# Patient Record
Sex: Female | Born: 1965
Health system: Southern US, Community
[De-identification: ages and names within clinical notes are randomized; demographics above are authoritative.]

## PROBLEM LIST (undated history)

## (undated) DIAGNOSIS — M7062 Trochanteric bursitis, left hip: Secondary | ICD-10-CM

## (undated) DIAGNOSIS — T7840XA Allergy, unspecified, initial encounter: Secondary | ICD-10-CM

## (undated) DIAGNOSIS — B029 Zoster without complications: Secondary | ICD-10-CM

## (undated) DIAGNOSIS — M25562 Pain in left knee: Secondary | ICD-10-CM

## (undated) HISTORY — DX: Pain in left knee: M25.562

## (undated) HISTORY — DX: Allergy, unspecified, initial encounter: T78.40XA

## (undated) HISTORY — DX: Trochanteric bursitis, left hip: M70.62

## (undated) HISTORY — DX: Zoster without complications: B02.9

---

## 2007-12-12 ENCOUNTER — Encounter: Admission: RE | Admit: 2007-12-12 | Discharge: 2007-12-12 | Payer: Self-pay | Admitting: Family Medicine

## 2010-07-30 ENCOUNTER — Encounter: Admission: RE | Admit: 2010-07-30 | Discharge: 2010-07-30 | Payer: Self-pay | Admitting: Family Medicine

## 2012-03-01 ENCOUNTER — Other Ambulatory Visit: Payer: Self-pay | Admitting: Internal Medicine

## 2012-03-01 ENCOUNTER — Other Ambulatory Visit: Payer: Self-pay | Admitting: Surgery

## 2012-03-01 DIAGNOSIS — N939 Abnormal uterine and vaginal bleeding, unspecified: Secondary | ICD-10-CM

## 2012-03-05 ENCOUNTER — Ambulatory Visit
Admission: RE | Admit: 2012-03-05 | Discharge: 2012-03-05 | Disposition: A | Discharge status: Home / Self Care | Payer: No Typology Code available for payment source | Source: Ambulatory Visit | Attending: Internal Medicine | Admitting: Internal Medicine

## 2012-03-05 DIAGNOSIS — N939 Abnormal uterine and vaginal bleeding, unspecified: Secondary | ICD-10-CM

## 2012-12-25 ENCOUNTER — Encounter (HOSPITAL_COMMUNITY): Payer: Self-pay | Admitting: *Deleted

## 2012-12-25 ENCOUNTER — Emergency Department (HOSPITAL_COMMUNITY)
Admission: EM | Admit: 2012-12-25 | Discharge: 2012-12-25 | Disposition: A | Discharge status: Home / Self Care | Payer: No Typology Code available for payment source | Attending: Emergency Medicine | Admitting: Emergency Medicine

## 2012-12-25 ENCOUNTER — Emergency Department (HOSPITAL_COMMUNITY): Payer: No Typology Code available for payment source

## 2012-12-25 DIAGNOSIS — M542 Cervicalgia: Secondary | ICD-10-CM

## 2012-12-25 DIAGNOSIS — M549 Dorsalgia, unspecified: Secondary | ICD-10-CM

## 2012-12-25 DIAGNOSIS — S0993XA Unspecified injury of face, initial encounter: Secondary | ICD-10-CM | POA: Insufficient documentation

## 2012-12-25 DIAGNOSIS — S199XXA Unspecified injury of neck, initial encounter: Secondary | ICD-10-CM | POA: Insufficient documentation

## 2012-12-25 DIAGNOSIS — IMO0002 Reserved for concepts with insufficient information to code with codable children: Secondary | ICD-10-CM | POA: Insufficient documentation

## 2012-12-25 DIAGNOSIS — Y92410 Unspecified street and highway as the place of occurrence of the external cause: Secondary | ICD-10-CM | POA: Insufficient documentation

## 2012-12-25 DIAGNOSIS — Y9389 Activity, other specified: Secondary | ICD-10-CM | POA: Insufficient documentation

## 2012-12-25 MED ORDER — NAPROXEN 500 MG PO TABS: 500.0000 mg | ORAL_TABLET | Freq: Two times a day (BID) | ORAL | Status: DC

## 2012-12-25 MED ORDER — METHOCARBAMOL 500 MG PO TABS: 500.0000 mg | ORAL_TABLET | Freq: Two times a day (BID) | ORAL | Status: DC

## 2012-12-25 MED ORDER — HYDROCODONE-ACETAMINOPHEN 5-325 MG PO TABS: 2.0000 | ORAL_TABLET | Freq: Four times a day (QID) | ORAL | Status: DC | PRN
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
## 2012-12-25 NOTE — ED Notes (Signed)
Placed in cervical collar at triage

## 2012-12-26 NOTE — ED Provider Notes (Signed)
Medical screening examination/treatment/procedure(s) were performed by non-physician practitioner and as supervising physician I was immediately available for consultation/collaboration.   Gavin Pound. Oletta Lamas, MD 12/26/12 2129

## 2015-05-28 DIAGNOSIS — M7062 Trochanteric bursitis, left hip: Secondary | ICD-10-CM

## 2015-05-28 DIAGNOSIS — M25562 Pain in left knee: Secondary | ICD-10-CM

## 2015-05-28 HISTORY — DX: Pain in left knee: M25.562

## 2015-05-28 HISTORY — DX: Trochanteric bursitis, left hip: M70.62

## 2015-08-24 ENCOUNTER — Ambulatory Visit (INDEPENDENT_AMBULATORY_CARE_PROVIDER_SITE_OTHER): Payer: Self-pay | Admitting: Internal Medicine

## 2015-08-24 ENCOUNTER — Encounter: Payer: Self-pay | Admitting: Internal Medicine

## 2015-08-24 VITALS — BP 138/84 | HR 88 | Resp 12 | Ht 61.0 in | Wt 204.2 lb

## 2015-08-24 DIAGNOSIS — Z79899 Other long term (current) drug therapy: Secondary | ICD-10-CM

## 2015-08-24 DIAGNOSIS — M7062 Trochanteric bursitis, left hip: Secondary | ICD-10-CM

## 2015-08-24 DIAGNOSIS — M25562 Pain in left knee: Secondary | ICD-10-CM

## 2015-08-24 MED ORDER — DICLOFENAC SODIUM 75 MG PO TBEC: 75.0000 mg | DELAYED_RELEASE_TABLET | Freq: Two times a day (BID) | ORAL | Status: DC

## 2015-08-24 NOTE — Patient Instructions (Signed)
Drink a glass of water before every meal Drink 6-8 glasses of water daily Eat three meals daily Eat a protein and healthy fat with every meal (eggs,fish, chicken, [place] and limit red meats) Eat 5 servings of vegetables daily, mix the colors Eat 2 servings of fruit daily with skin, if skin is edible Use smaller plates Put food/utensils down as you chew and swallow each bite Eat at a table with friends/family at least once daily, no TV Do not eat in front of the TV  Call if you do not hear from orange Card about xray appointment for your knee  Keep doing exercises

## 2015-08-24 NOTE — Progress Notes (Signed)
   Subjective:    Patient ID: Ana Pratt, female    DOB: 01/22/1966, 49 y.o.   MRN: [id number]  HPI   1.  Left Greater Trochanteric Bursitis:  Hip is fine now.  Now just a problem with the knee.    2.  Left Knee pain:  Diclofenac helped a lot, but ran out about 2 weeks ago and did not refill as she was concerned she would develop some renal insufficiency as her mother has.  Does have an orange card.  She states it worked well even once daily. Knee is most bothersome when gets up in the morning and goes down 12 steps.  She is fine initially, then the pain kicks in as she goes down the steps--really going down steps is her only problem.  She feels the knee won't support her and most of the discomfort is at the back of the knee--feels like it will "pop" in the back.  Possibly also feels like something is "caught" in her knee. Has had this for 1 year, no history of injury.  No history of an effusion/swelling No GI issues with the Diclofenac.    Review of Systems     Objective:   Physical Exam Left Knee:  Mildly decreased flexion, full extension.  Good strength in extending against an opposing force.  No palpable effusion.  Legs obese.  Tender along joint line both laterally and medially.  Tender in posteriorly as well, but no palpable mass.  NT with pressure against anterior patella.   NT and no laxity on stress of cruciates and lateral/medial collateral ligaments. Gait is normal on flat ground Normal DP and PT pulses       Assessment & Plan:  1.  Greater trochanteric bursitis:  Resolved with exercises and Diclofenac for about 1 month.  2.  Left knee pain:  Not clear of cause.  Possible chronic internal derangement.  Check Xray to start.  Check BMet and encouraged pt. To take Diclofenac once daily as controls pain well.  Weight loss also encouraged. Return in 3 months.

## 2015-08-25 LAB — COMPREHENSIVE METABOLIC PANEL
A/G RATIO: 1.4 (ref 1.1–2.5)
ALT: 23 IU/L (ref 0–32)
AST: 22 IU/L (ref 0–40)
Albumin: 4.1 g/dL (ref 3.5–5.5)
Alkaline Phosphatase: 71 IU/L (ref 39–117)
BUN/Creatinine Ratio: 15 (ref 9–23)
BUN: 12 mg/dL (ref 6–24)
Bilirubin Total: 0.3 mg/dL (ref 0.0–1.2)
CALCIUM: 9.5 mg/dL (ref 8.7–10.2)
CO2: 23 mmol/L (ref 18–29)
Chloride: 103 mmol/L (ref 97–106)
Creatinine, Ser: 0.79 mg/dL (ref 0.57–1.00)
GFR, EST AFRICAN AMERICAN: 100 mL/min/{1.73_m2} (ref >59)
GFR, EST NON AFRICAN AMERICAN: 87 mL/min/{1.73_m2} (ref >59)
GLUCOSE: 61 mg/dL — AB (ref 65–99)
Globulin, Total: 3 g/dL (ref 1.5–4.5)
POTASSIUM: 4.3 mmol/L (ref 3.5–5.2)
Sodium: 143 mmol/L (ref 136–144)
TOTAL PROTEIN: 7.1 g/dL (ref 6.0–8.5)

## 2015-09-03 NOTE — Progress Notes (Signed)
Quick Note:  09/03/15 Patient informed of results. ______

## 2015-10-26 ENCOUNTER — Encounter: Payer: Self-pay | Admitting: Internal Medicine

## 2015-10-26 ENCOUNTER — Ambulatory Visit (INDEPENDENT_AMBULATORY_CARE_PROVIDER_SITE_OTHER): Payer: Self-pay | Admitting: Internal Medicine

## 2015-10-26 VITALS — BP 132/80 | HR 78 | Ht 61.0 in | Wt 206.0 lb

## 2015-10-26 DIAGNOSIS — E669 Obesity, unspecified: Secondary | ICD-10-CM

## 2015-10-26 DIAGNOSIS — Z1239 Encounter for other screening for malignant neoplasm of breast: Secondary | ICD-10-CM

## 2015-10-26 DIAGNOSIS — M25562 Pain in left knee: Secondary | ICD-10-CM

## 2015-10-26 DIAGNOSIS — J302 Other seasonal allergic rhinitis: Secondary | ICD-10-CM

## 2015-10-26 MED ORDER — MOMETASONE FUROATE 50 MCG/ACT NA SUSP
NASAL | Status: DC
Start: 2015-10-26 — End: 2018-05-07

## 2015-10-26 MED ORDER — DESLORATADINE 5 MG PO TABS: 5.0000 mg | ORAL_TABLET | Freq: Every day | ORAL | Status: DC

## 2015-10-26 NOTE — Patient Instructions (Addendum)
Can google "advance directives, [place]"  And bring up form from Secretary of [place]. Print and fill out Or can go to "5 wishes"  Which is also in Spanish and fill out--this costs $5--perhaps easier to use. Designate a Cytogeneticist to speak for you if you are unable to speak for yourself when ill or injured  Drink a glass of water before every meal Drink 6-8 glasses of water daily Eat three meals daily Eat a protein and healthy fat with every meal (eggs,fish, chicken, [place] and limit red meats) Eat 5 servings of vegetables daily, mix the colors Eat 2 servings of fruit daily with skin, if skin is edible Use smaller plates Put food/utensils down as you chew and swallow each bite Eat at a table with friends/family at least once daily, no TV Do not eat in front of the TV  Get your [employer] done and in by tomorrow  Get started with walking laps in the pool (and start swim lessons) or utilizing elliptical or stationery bike 3 times weekly and do something else at home the other days.

## 2015-10-26 NOTE — Progress Notes (Signed)
   Subjective:    Patient ID: Ana Pratt, female    DOB: 13-Sep-1966, 50 y.o.   MRN: [id number]  HPI  Left Knee pain:  Taking Diclofenac regularly.  Pain well controlled when she takes twice daily.  Did not get the knee xray as ordered in November--never heard.    HM:  Documented from patient and old records health maintenance.   Will never get the influenzae vaccine. Cholesterol panel in 2015 showed total of 191, Triglycerides at 46, HDL of 71, and LDL 111.   TSH was 2.09 04/2014 as well.      Review of Systems     Objective:   Physical Exam HEENT:  PERRL, EOMI, eyelids a bit puffy, TMs pearly gray, unable to adequately see posterior pharynx, but no obvious erythems.  Tender over bilateral maxillary sinuses.  Nasal mucosa swollen and boggy Neck:  Supple, no adenopathy Chest:  CTA CV:  RRR without murmur or rub Radial and DP pulses normal and equal Left Knee:  No findings.  Gait is smooth        Assessment & Plan:  1.  Left Knee pain:  Continue Diclofenac.  Needs Xray still.  Has been submitted [facility] to [facility].  Encouraged physical activity to strengthen quads.  2.  Seasonal allergies:  Seems to be affecting patient with the warm weather and early blooming/moisture.  To start Clarinex and Nasonex.  To call if does not help.  May actually receive Cetirizine at [facility] pharmacy.  Can purchase decongestant separately if needed.  3.  Obesity:  Long discussion regarding healthy eating and physical activity.  Pt. To make short term goals to get started at [employer] with scholarship.  4.  HM:  Needs Mammogram States will never get a flu vaccine.   Unwilling to really give a reason. Tdap, colonoscopy, FLP, Hep C, pap up to date  3.

## 2015-10-26 NOTE — Addendum Note (Signed)
Addended by: Marcene Duos on: 10/26/2015 12:43 PM   Modules accepted: Kipp Brood

## 2015-10-29 ENCOUNTER — Other Ambulatory Visit: Payer: Self-pay | Admitting: Internal Medicine

## 2015-10-29 DIAGNOSIS — Z1231 Encounter for screening mammogram for malignant neoplasm of breast: Secondary | ICD-10-CM

## 2015-11-11 ENCOUNTER — Telehealth: Payer: Self-pay | Admitting: Internal Medicine

## 2015-11-12 ENCOUNTER — Ambulatory Visit
Admission: RE | Admit: 2015-11-12 | Discharge: 2015-11-12 | Disposition: A | Discharge status: Home / Self Care | Payer: No Typology Code available for payment source | Source: Ambulatory Visit | Attending: Internal Medicine | Admitting: Internal Medicine

## 2015-11-12 DIAGNOSIS — Z1231 Encounter for screening mammogram for malignant neoplasm of breast: Secondary | ICD-10-CM

## 2016-08-08 ENCOUNTER — Ambulatory Visit (INDEPENDENT_AMBULATORY_CARE_PROVIDER_SITE_OTHER): Payer: Self-pay | Admitting: Internal Medicine

## 2016-08-08 ENCOUNTER — Encounter: Payer: Self-pay | Admitting: Internal Medicine

## 2016-08-08 VITALS — BP 124/88 | HR 74 | Resp 12 | Ht 60.5 in | Wt 204.0 lb

## 2016-08-08 DIAGNOSIS — B029 Zoster without complications: Secondary | ICD-10-CM

## 2016-08-08 HISTORY — DX: Zoster without complications: B02.9

## 2016-08-08 MED ORDER — ACYCLOVIR 800 MG PO TABS: 800.0000 mg | ORAL_TABLET | Freq: Every day | ORAL | 0 refills | Status: DC

## 2016-08-08 MED ORDER — TRIAMCINOLONE ACETONIDE 0.1 % EX CREA: 1.0000 "application " | TOPICAL_CREAM | Freq: Two times a day (BID) | CUTANEOUS | 0 refills | Status: DC

## 2016-08-08 NOTE — Patient Instructions (Signed)
Capsaicin cream as needed to painful spots as needed. Don't rub your face with your left hand--especially stay away from your eyes

## 2016-08-08 NOTE — Progress Notes (Signed)
   Subjective:    Patient ID: Ana Pratt, female    DOB: 06/24/1966, 50 y.o.   MRN: [id number]  HPI   Left hand and forearm with rash that is itchy, burning, and painful.  Started about 6 days ago.   Meds: None  No Known Allergies    Review of Systems     Objective:   Physical Exam Cluster of deep vesicles at base of ulnar palm extending onto volar wrist.  Cluster of vesicles overlying PIP joint of 5th finger, small cluster in between middle and index finger MCP joints and scattered cluster on dorsal ulnar wrist.       Assessment & Plan:  Herpes Zoster exanthem involving what appears to be C8 dermatome. Acyclovir 800 mg 5 times daily for 7 days.  To call if too expensive.   Also to pick up capsaicin cream OTC and use as needed as well as Triamcinolone cream 0.1 % as needed.

## 2016-09-06 ENCOUNTER — Encounter: Payer: Self-pay | Admitting: Internal Medicine

## 2018-05-01 ENCOUNTER — Ambulatory Visit (INDEPENDENT_AMBULATORY_CARE_PROVIDER_SITE_OTHER): Payer: Self-pay | Admitting: Physician Assistant

## 2018-05-07 ENCOUNTER — Other Ambulatory Visit: Payer: Self-pay

## 2018-05-07 ENCOUNTER — Ambulatory Visit (INDEPENDENT_AMBULATORY_CARE_PROVIDER_SITE_OTHER): Payer: BLUE CROSS/BLUE SHIELD | Admitting: Physician Assistant

## 2018-05-07 ENCOUNTER — Encounter (INDEPENDENT_AMBULATORY_CARE_PROVIDER_SITE_OTHER): Payer: Self-pay | Admitting: Physician Assistant

## 2018-05-07 VITALS — BP 151/106 | HR 77 | Temp 98.2°F | Ht 61.81 in | Wt 211.0 lb

## 2018-05-07 DIAGNOSIS — R030 Elevated blood-pressure reading, without diagnosis of hypertension: Secondary | ICD-10-CM

## 2018-05-07 DIAGNOSIS — Z131 Encounter for screening for diabetes mellitus: Secondary | ICD-10-CM | POA: Diagnosis not present

## 2018-05-07 DIAGNOSIS — M25562 Pain in left knee: Secondary | ICD-10-CM

## 2018-05-07 DIAGNOSIS — M25561 Pain in right knee: Secondary | ICD-10-CM | POA: Diagnosis not present

## 2018-05-07 DIAGNOSIS — G8929 Other chronic pain: Secondary | ICD-10-CM

## 2018-05-07 LAB — POCT GLYCOSYLATED HEMOGLOBIN (HGB A1C): Hemoglobin A1C: 5.2 % (ref 4.0–5.6)

## 2018-05-07 MED ORDER — NAPROXEN 500 MG PO TABS: 500.0000 mg | ORAL_TABLET | Freq: Two times a day (BID) | ORAL | 0 refills | Status: DC

## 2018-05-07 NOTE — Patient Instructions (Signed)

## 2018-05-07 NOTE — Progress Notes (Signed)
Subjective:  Patient ID: Ana Pratt, female    DOB: 04/30/1966  Age: 52 y.o. MRN: [id number]  CC: joint pain   HPI Ana Pratt is a 52 y.o. female with a medical history of shingles, trochanteric bursitis, and seasonal allergies presents as a new patient with complaint of bilateral knee pain L > R for approximately one year. Lateral aspect of the knees are most pain and mostly when going up or down stairs. There is also an occasional "giving away" of the knee while walking. Knee has never "locked" before. Has not been seen by any outside providers. Takes Tumeric and "Tylenol Arthritis". Works as a Programmer, applicationshouse keeper and says regularly cleans the bathroom floors on her knees. Does not use knee pads as they usually fall off and become misplaced. Says there is also some pain in the ankles. Does not endorse any other symptoms or complaints.     Outpatient Medications Prior to Visit  Medication Sig Dispense Refill  . Multiple Vitamins-Minerals (MULTIVITAMIN WITH MINERALS) tablet Take 1 tablet by mouth daily.    . fish oil-omega-3 fatty acids 1000 MG capsule Take 1 g by mouth daily.    Marland Kitchen. acyclovir (ZOVIRAX) 800 MG tablet Take 1 tablet (800 mg total) by mouth 5 (five) times daily. For 7 days 35 tablet 0  . desloratadine (CLARINEX) 5 MG tablet Take 1 tablet (5 mg total) by mouth daily. (Patient not taking: Reported on 08/08/2016) 30 tablet 11  . diclofenac (VOLTAREN) 75 MG EC tablet Take 1 tablet (75 mg total) by mouth 2 (two) times daily. (Patient not taking: Reported on 08/08/2016) 60 tablet 6  . HYDROcodone-acetaminophen (NORCO/VICODIN) 5-325 MG per tablet Take 2 tablets by mouth every 6 (six) hours as needed for pain. 10 tablet 0  . methocarbamol (ROBAXIN) 500 MG tablet Take 1 tablet (500 mg total) by mouth 2 (two) times daily. 20 tablet 0  . mometasone (NASONEX) 50 MCG/ACT nasal spray 2 sprays each nostril once daily (Patient not taking: Reported on 08/08/2016) 17 g 11   . naproxen (NAPROSYN) 500 MG tablet Take 1 tablet (500 mg total) by mouth 2 (two) times daily. 30 tablet 0  . triamcinolone cream (KENALOG) 0.1 % Apply 1 application topically 2 (two) times daily. 80 g 0   No facility-administered medications prior to visit.      ROS Review of Systems  Constitutional: Negative for chills, fever and malaise/fatigue.  Eyes: Negative for blurred vision.  Respiratory: Negative for shortness of breath.   Cardiovascular: Negative for chest pain and palpitations.  Gastrointestinal: Negative for abdominal pain and nausea.  Genitourinary: Negative for dysuria and hematuria.  Musculoskeletal: Positive for joint pain. Negative for myalgias.  Skin: Negative for rash.  Neurological: Negative for tingling and headaches.  Psychiatric/Behavioral: Negative for depression. The patient is not nervous/anxious.     Objective:  Ht 5' 1.81" (1.57 m)   Wt 211 lb (95.7 kg)   LMP 07/27/2014 (Approximate) Comment: None for a year  BMI 38.83 kg/m    Vitals:   05/07/18 0858  BP: (!) 151/106  Pulse: 77  Temp: 98.2 F (36.8 C)  TempSrc: Oral  SpO2: 100%  Weight: 211 lb (95.7 kg)  Height: 5' 1.81" (1.57 m)     Physical Exam  Constitutional: She is oriented to person, place, and time.  Well developed, obese, NAD, polite  HENT:  Head: Normocephalic and atraumatic.  Eyes: No scleral icterus.  Neck: Normal range of motion. Neck supple. No thyromegaly present.  Cardiovascular: Normal rate, regular rhythm and normal heart sounds.  No LE edema bilaterally  Pulmonary/Chest: Effort normal and breath sounds normal.  Musculoskeletal: She exhibits no edema.  TTP in the lateral aspect of midline of knee bilaterally. Negative posterior/anterior drawer. Negative varus and valgus stress testing. Negative Thesaley test negative. Full aROM of knee.   Neurological: She is alert and oriented to person, place, and time.  Skin: Skin is warm and dry. No rash noted. No erythema. No  pallor.  Psychiatric: She has a normal mood and affect. Her behavior is normal. Thought content normal.  Vitals reviewed.    Assessment & Plan:   1. Chronic pain of both knees - DG Knee Complete 4 Views Left; Future - DG Knee Complete 4 Views Right; Future - Begin naproxen (NAPROSYN) 500 MG tablet; Take 1 tablet (500 mg total) by mouth 2 (two) times daily with a meal.  Dispense: 60 tablet; Refill: 0  2. Screening for diabetes mellitus - HgB A1c 5.2%  3. Elevated blood pressure reading without diagnosis of hypertension - Will recheck in one month  Meds ordered this encounter  Medications  . naproxen (NAPROSYN) 500 MG tablet    Sig: Take 1 tablet (500 mg total) by mouth 2 (two) times daily with a meal.    Dispense:  60 tablet    Refill:  0    Order Specific Question:   Supervising Provider    Answer:   Hoy RegisterNEWLIN, ENOBONG [[id number]]    Follow-up: Return in about 4 weeks (around 06/04/2018) for PAP, mammogram, and BP .   Loletta Specteroger David Tiyona Desouza PA

## 2018-05-25 ENCOUNTER — Ambulatory Visit (HOSPITAL_COMMUNITY)
Admission: RE | Admit: 2018-05-25 | Discharge: 2018-05-25 | Disposition: A | Discharge status: Home / Self Care | Payer: BLUE CROSS/BLUE SHIELD | Source: Ambulatory Visit | Attending: Physician Assistant | Admitting: Physician Assistant

## 2018-05-25 DIAGNOSIS — M25562 Pain in left knee: Secondary | ICD-10-CM | POA: Diagnosis present

## 2018-05-25 DIAGNOSIS — M170 Bilateral primary osteoarthritis of knee: Secondary | ICD-10-CM | POA: Insufficient documentation

## 2018-05-25 DIAGNOSIS — G8929 Other chronic pain: Secondary | ICD-10-CM | POA: Diagnosis not present

## 2018-05-25 DIAGNOSIS — M25561 Pain in right knee: Secondary | ICD-10-CM | POA: Insufficient documentation

## 2018-05-29 ENCOUNTER — Other Ambulatory Visit (INDEPENDENT_AMBULATORY_CARE_PROVIDER_SITE_OTHER): Payer: Self-pay | Admitting: Physician Assistant

## 2018-05-29 DIAGNOSIS — M175 Other unilateral secondary osteoarthritis of knee: Secondary | ICD-10-CM

## 2018-05-31 ENCOUNTER — Telehealth (INDEPENDENT_AMBULATORY_CARE_PROVIDER_SITE_OTHER): Payer: Self-pay

## 2018-05-31 NOTE — Telephone Encounter (Signed)
-----   Message from Loletta Specter, PA-C sent at 05/29/2018 12:59 PM EDT ----- Moderate to severe tricompartment osteoarthritis secondary to calcium crystal deposits. Will need to refer to orthopedics and have blood work done here for hyperparathyroidism, Wilson's diseaes, and hemachromatosis.

## 2018-05-31 NOTE — Telephone Encounter (Signed)
Left voicemail for someone to return call to [facility] at [phone]. Did not leave detailed message as it was not clear if the voicemail belonged to patient. Maryjean Morn, CMA

## 2018-06-01 ENCOUNTER — Telehealth (INDEPENDENT_AMBULATORY_CARE_PROVIDER_SITE_OTHER): Payer: Self-pay

## 2018-06-01 NOTE — Telephone Encounter (Signed)
-----   Message from Roger David Gomez, PA-C sent at 05/29/2018 12:59 PM EDT ----- Moderate to severe tricompartment osteoarthritis secondary to calcium crystal deposits. Will need to refer to orthopedics and have blood work done here for hyperparathyroidism, Wilson's diseaes, and hemachromatosis. 

## 2018-06-01 NOTE — Telephone Encounter (Signed)
Patient returned call. She did verify that the voicemail does belong to her but has a business greeting as she owns a [employer]. Patient is aware that crystal deposits in knee are causing osteoarthritis and the need for additional blood work to see why the crystals are forming; and referral to ortho. Patient has scheduled for 9/19. Maryjean Morn, CMA

## 2018-06-05 ENCOUNTER — Encounter (INDEPENDENT_AMBULATORY_CARE_PROVIDER_SITE_OTHER): Payer: Self-pay | Admitting: Orthopaedic Surgery

## 2018-06-05 ENCOUNTER — Ambulatory Visit (INDEPENDENT_AMBULATORY_CARE_PROVIDER_SITE_OTHER): Payer: BLUE CROSS/BLUE SHIELD | Admitting: Orthopaedic Surgery

## 2018-06-05 VITALS — Ht 61.0 in | Wt 210.0 lb

## 2018-06-05 DIAGNOSIS — M1712 Unilateral primary osteoarthritis, left knee: Secondary | ICD-10-CM

## 2018-06-05 DIAGNOSIS — M1711 Unilateral primary osteoarthritis, right knee: Secondary | ICD-10-CM

## 2018-06-05 NOTE — Progress Notes (Signed)
Office Visit Note   Patient: Ana Pratt           Date of Birth: 10-23-1965           MRN: [id number] Visit Date: 06/05/2018              Requested by: Loletta Specter, PA-C [address] [place], [place] [address] PCP: Loletta Specter, PA-C   Assessment & Plan: Visit Diagnoses:  1. Unilateral primary osteoarthritis, left knee   2. Unilateral primary osteoarthritis, right knee     Plan: Impression is bilateral knee osteoarthritis.  Overall her pain is 1 out of 10 which is fortunate.  We discussed nonsurgical and conservative treatment options for symptomatic relief as well as weight loss which I think could potentially slow down the progression of her degenerative joint disease.  Bilateral knee compression sleeve was applied today to help with her job.  Sample for Pennsaid was given today.  Questions encouraged and answered.  Follow-up as needed.  Follow-Up Instructions: Return if symptoms worsen or fail to improve.   Orders:  No orders of the defined types were placed in this encounter.  No orders of the defined types were placed in this encounter.     Procedures: No procedures performed   Clinical Data: No additional findings.   Subjective: Chief Complaint  Patient presents with  . Left Knee - Pain  . Right Knee - Pain    Patient is a very pleasant 52 year old female comes in with left greater than right knee pain for 1 year.  She does endorse buckling and cracking but denies any mechanical symptoms.  Denies any injuries.  Denies any numbness and tingling or swelling.  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  Denies any radiation pain.  Denies any previous history of surgery.   Review of Systems  Constitutional: Negative.   HENT: Negative.   Eyes: Negative.   Respiratory: Negative.   Cardiovascular: Negative.   Endocrine: Negative.   Musculoskeletal: Negative.   Neurological: Negative.   Hematological:  Negative.   Psychiatric/Behavioral: Negative.   All other systems reviewed and are negative.    Objective: Vital Signs: Ht 5\' 1"  (1.549 m)   Wt 210 lb (95.3 kg)   LMP 07/27/2014 (Approximate) Comment: None for a year  BMI 39.68 kg/m   Physical Exam  Constitutional: She is oriented to person, place, and time. She appears well-developed and well-nourished.  HENT:  Head: Normocephalic and atraumatic.  Eyes: EOM are normal.  Neck: Neck supple.  Pulmonary/Chest: Effort normal.  Abdominal: Soft.  Neurological: She is alert and oriented to person, place, and time.  Skin: Skin is warm. Capillary refill takes less than 2 seconds.  Psychiatric: She has a normal mood and affect. Her behavior is normal. Judgment and thought content normal.  Nursing note and vitals reviewed.   Ortho Exam Bilateral knee exam shows no joint effusion.  Collaterals and cruciates are stable.  Normal range of motion.  2+ patellofemoral crepitus. Specialty Comments:  No specialty comments available.  Imaging: No results found.   PMFS History: Patient Active Problem List   Diagnosis Date Noted  . Obesity 10/26/2015   Past Medical History:  Diagnosis Date  . Allergy    Spring--uses Claritin D.  . Greater trochanteric bursitis of left hip 05/2015  . Knee pain, left 05/2015  . Shingles age 23 or 52 yo  . Shingles 08/08/2016   Left C8 dermatome  Family History  Problem Relation Age of Onset  . Hypertension Mother   . Arthritis Mother   . Kidney disease Mother     Past Surgical History:  Procedure Laterality Date  . CESAREAN SECTION    . CESAREAN SECTION  1989, 2003   Twin birth in 1989   Social History   Occupational History  . Occupation: Merchant navy officer    Comment: [employer] and homes on the side  Tobacco Use  . Smoking status: Never Smoker  . Smokeless tobacco: Never Used  Substance and Sexual Activity  . Alcohol use: No    Alcohol/week: 0.0 standard drinks   . Drug use: No  . Sexual activity: Not on file

## 2018-06-14 ENCOUNTER — Other Ambulatory Visit: Payer: Self-pay

## 2018-06-14 ENCOUNTER — Other Ambulatory Visit (HOSPITAL_COMMUNITY)
Admission: RE | Admit: 2018-06-14 | Discharge: 2018-06-14 | Disposition: A | Discharge status: Home / Self Care | Payer: BLUE CROSS/BLUE SHIELD | Source: Ambulatory Visit | Attending: Physician Assistant | Admitting: Physician Assistant

## 2018-06-14 ENCOUNTER — Ambulatory Visit (INDEPENDENT_AMBULATORY_CARE_PROVIDER_SITE_OTHER): Payer: BLUE CROSS/BLUE SHIELD | Admitting: Physician Assistant

## 2018-06-14 ENCOUNTER — Encounter (INDEPENDENT_AMBULATORY_CARE_PROVIDER_SITE_OTHER): Payer: Self-pay | Admitting: Physician Assistant

## 2018-06-14 VITALS — BP 155/102 | HR 67 | Temp 98.0°F | Ht 61.0 in | Wt 217.6 lb

## 2018-06-14 DIAGNOSIS — M25562 Pain in left knee: Secondary | ICD-10-CM | POA: Insufficient documentation

## 2018-06-14 DIAGNOSIS — G8929 Other chronic pain: Secondary | ICD-10-CM

## 2018-06-14 DIAGNOSIS — I10 Essential (primary) hypertension: Secondary | ICD-10-CM | POA: Insufficient documentation

## 2018-06-14 DIAGNOSIS — Z124 Encounter for screening for malignant neoplasm of cervix: Secondary | ICD-10-CM

## 2018-06-14 DIAGNOSIS — Z1231 Encounter for screening mammogram for malignant neoplasm of breast: Secondary | ICD-10-CM | POA: Diagnosis not present

## 2018-06-14 DIAGNOSIS — Z1239 Encounter for other screening for malignant neoplasm of breast: Secondary | ICD-10-CM

## 2018-06-14 DIAGNOSIS — M25561 Pain in right knee: Secondary | ICD-10-CM | POA: Diagnosis not present

## 2018-06-14 MED ORDER — HYDROCHLOROTHIAZIDE 12.5 MG PO TABS: 12.5000 mg | ORAL_TABLET | Freq: Every day | ORAL | 6 refills | Status: DC

## 2018-06-14 MED ORDER — AMLODIPINE BESYLATE 5 MG PO TABS: 5.0000 mg | ORAL_TABLET | Freq: Every day | ORAL | 6 refills | Status: DC

## 2018-06-14 MED ORDER — DICLOFENAC SODIUM 1.5 % TD SOLN: TRANSDERMAL | 5 refills | Status: DC

## 2018-06-14 NOTE — Patient Instructions (Signed)

## 2018-06-14 NOTE — Progress Notes (Signed)
Subjective:  Patient ID: Ana Pratt, female    DOB: 1966-07-02  Age: 52 y.o. MRN: [id number]  CC: BP check and pap  HPI Ana Pratt is a 52 y.o. female with a medical history of shingles, trochanteric bursitis, and seasonal allergies presents for a BP check and pap smear. Denies vaginal discharge, dyspareunia, pelvic pain, genital lesions, f/c/n/v.     Last BP 151/106 mmHg 5 weeks ago. Diagnosed with elevated BP w/o dx of HTN. BP 155/102 mmHg in clinic today. Does not endorse any cardiovascular symptoms.     Outpatient Medications Prior to Visit  Medication Sig Dispense Refill  . Multiple Vitamins-Minerals (MULTIVITAMIN WITH MINERALS) tablet Take 1 tablet by mouth daily.    . fish oil-omega-3 fatty acids 1000 MG capsule Take 1 g by mouth daily.    . naproxen (NAPROSYN) 500 MG tablet Take 1 tablet (500 mg total) by mouth 2 (two) times daily with a meal. 60 tablet 0   No facility-administered medications prior to visit.      ROS Review of Systems  Constitutional: Negative for chills, fever and malaise/fatigue.  Eyes: Negative for blurred vision.  Respiratory: Negative for shortness of breath.   Cardiovascular: Negative for chest pain and palpitations.  Gastrointestinal: Negative for abdominal pain and nausea.  Genitourinary: Negative for dysuria and hematuria.  Musculoskeletal: Negative for joint pain and myalgias.  Skin: Negative for rash.  Neurological: Negative for tingling and headaches.  Psychiatric/Behavioral: Negative for depression. The patient is not nervous/anxious.     Objective:  BP (!) 155/102 (BP Location: Left Arm, Patient Position: Sitting, Cuff Size: Large)   Pulse 67   Temp 98 F (36.7 C) (Oral)   Ht 5\' 1"  (1.549 m)   Wt 217 lb 9.6 oz (98.7 kg)   LMP 07/27/2014 (Approximate) Comment: None for a year  SpO2 100%   BMI 41.12 kg/m   BP/Weight 06/14/2018 06/05/2018 05/07/2018  Systolic BP 155 - 151  Diastolic BP 102 - 106  Wt. (Lbs)  217.6 210 211  BMI 41.12 39.68 38.83      Physical Exam  Constitutional: She is oriented to person, place, and time.  Well developed, obese, NAD, polite  HENT:  Head: Normocephalic and atraumatic.  Eyes: No scleral icterus.  Neck: Normal range of motion. Neck supple. No thyromegaly present.  Pulmonary/Chest: Effort normal.  Genitourinary:  Genitourinary Comments: No cervical motion tenderness. No adnexal tenderness to palpation or mass felt bilaterally. No uterine mass or tenderness.   Musculoskeletal: She exhibits no edema.  Neurological: She is alert and oriented to person, place, and time.  Skin: Skin is warm and dry. No rash noted. No erythema. No pallor.  Psychiatric: She has a normal mood and affect. Her behavior is normal. Thought content normal.  Vitals reviewed.    Assessment & Plan:   1. Screening for cervical cancer - Cytology - PAP([facility])  2. Screening for breast cancer - [facility] DIGITAL SCREENING BILATERAL; Future  3. Hypertension, unspecified type - Begin hydrochlorothiazide (HYDRODIURIL) 12.5 MG tablet; Take 1 tablet (12.5 mg total) by mouth daily.  Dispense: 30 tablet; Refill: 6 - Begin amLODipine (NORVASC) 5 MG tablet; Take 1 tablet (5 mg total) by mouth daily.  Dispense: 30 tablet; Refill: 6  4. Chronic pain of both knees - Begin Diclofenac Sodium 1.5 % SOLN; Apply two pump activations to affected knee two times a day.  Dispense: 1 Bottle; Refill: 5 - Pt had been discharged when she asked to have Pennsaid  prescribed for her. Says that a friend gave her samples and felt much relief. Does not like taking pills.   Meds ordered this encounter  Medications  . hydrochlorothiazide (HYDRODIURIL) 12.5 MG tablet    Sig: Take 1 tablet (12.5 mg total) by mouth daily.    Dispense:  30 tablet    Refill:  6    Order Specific Question:   Supervising Provider    Answer:   Hoy RegisterNEWLIN, ENOBONG [[id number]]  . amLODipine (NORVASC) 5 MG tablet    Sig: Take 1 tablet (5 mg total)  by mouth daily.    Dispense:  30 tablet    Refill:  6    Order Specific Question:   Supervising Provider    Answer:   Hoy RegisterNEWLIN, ENOBONG [[id number]]  . Diclofenac Sodium 1.5 % SOLN    Sig: Apply two pump activations to affected knee two times a day.    Dispense:  1 Bottle    Refill:  5    Order Specific Question:   Supervising Provider    Answer:   Hoy RegisterNEWLIN, ENOBONG [[id number]]    Follow-up: Return in about 4 weeks (around 07/12/2018) for HTN f/u.   Loletta Specteroger David Gomez PA

## 2018-06-15 LAB — CYTOLOGY - PAP
Bacterial vaginitis: NEGATIVE
CHLAMYDIA, DNA PROBE: NEGATIVE
Candida vaginitis: NEGATIVE
Diagnosis: NEGATIVE
NEISSERIA GONORRHEA: NEGATIVE
TRICH (WINDOWPATH): NEGATIVE

## 2018-06-18 ENCOUNTER — Telehealth (INDEPENDENT_AMBULATORY_CARE_PROVIDER_SITE_OTHER): Payer: Self-pay

## 2018-06-18 NOTE — Telephone Encounter (Signed)
Patient aware that pap is completely negative. Ana Pratt, CMA

## 2018-06-18 NOTE — Telephone Encounter (Signed)
-----   Message from Loletta Specteroger David Gomez, PA-C sent at 06/18/2018  8:49 AM EDT ----- Pap completely negative.

## 2018-06-25 ENCOUNTER — Telehealth (INDEPENDENT_AMBULATORY_CARE_PROVIDER_SITE_OTHER): Payer: Self-pay | Admitting: Orthopaedic Surgery

## 2018-06-25 NOTE — Telephone Encounter (Signed)
Beth from [facility] called needing order for PT faxed over to her. The fax number is [phone]. The phone number is [phone]

## 2018-06-25 NOTE — Telephone Encounter (Signed)
Did you want patient to do PT?

## 2018-06-26 NOTE — Telephone Encounter (Signed)
faxed

## 2018-06-26 NOTE — Telephone Encounter (Signed)
Yes for OA of knees.  Quad strengthening.  HEP

## 2018-07-18 ENCOUNTER — Encounter (INDEPENDENT_AMBULATORY_CARE_PROVIDER_SITE_OTHER): Payer: Self-pay | Admitting: Physician Assistant

## 2018-07-18 ENCOUNTER — Ambulatory Visit (INDEPENDENT_AMBULATORY_CARE_PROVIDER_SITE_OTHER): Payer: BLUE CROSS/BLUE SHIELD | Admitting: Physician Assistant

## 2018-07-18 VITALS — BP 126/85 | HR 83 | Temp 98.2°F | Resp 18 | Ht 61.0 in | Wt 216.0 lb

## 2018-07-18 DIAGNOSIS — G8929 Other chronic pain: Secondary | ICD-10-CM | POA: Diagnosis not present

## 2018-07-18 DIAGNOSIS — M25561 Pain in right knee: Secondary | ICD-10-CM

## 2018-07-18 DIAGNOSIS — I10 Essential (primary) hypertension: Secondary | ICD-10-CM

## 2018-07-18 DIAGNOSIS — M25562 Pain in left knee: Secondary | ICD-10-CM

## 2018-07-18 MED ORDER — MELOXICAM 15 MG PO TABS: 15.0000 mg | ORAL_TABLET | Freq: Every day | ORAL | 1 refills | Status: DC

## 2018-07-18 NOTE — Patient Instructions (Signed)

## 2018-07-18 NOTE — Progress Notes (Signed)
Subjective:  Patient ID: Ana Pratt, female    DOB: 07/29/1966  Age: 52 y.o. MRN: [id number]  CC: HTN f/u   HPI [place] Erica Pratt a 52 y.o.femalewith a medical history of shingles, trochanteric bursitis, and seasonal allergies presents for HTN. Last BP 155/102 mmHg last month. She was prescribed HCTZ 12.5 mg and amlodipine 5 mg. BP 126/85 mmHg today. Did not take her anti-hypertensives yet today as she came in fasting for blood draw. Says she is feeling well and her knee pain has decreased significantly with physical therapy. Dr Roda Shutters is her orthopedic specialist and has diagnosed primary OA of the knee bilaterally. Otherwise, pt denies CP, palpitations, PND, orthopnea, presyncope, syncope, SOB, HA, tingling, numbness, abdominal pain, f/c/n/v, rash, swelling, or GI/GU sxs.     Outpatient Medications Prior to Visit  Medication Sig Dispense Refill  . amLODipine (NORVASC) 5 MG tablet Take 1 tablet (5 mg total) by mouth daily. 30 tablet 6  . Diclofenac Sodium 1.5 % SOLN Apply two pump activations to affected knee two times a day. 1 Bottle 5  . fish oil-omega-3 fatty acids 1000 MG capsule Take 1 g by mouth daily.    . hydrochlorothiazide (HYDRODIURIL) 12.5 MG tablet Take 1 tablet (12.5 mg total) by mouth daily. 30 tablet 6  . Multiple Vitamins-Minerals (MULTIVITAMIN WITH MINERALS) tablet Take 1 tablet by mouth daily.     No facility-administered medications prior to visit.      ROS Review of Systems  Constitutional: Negative for chills, fever and malaise/fatigue.  Eyes: Negative for blurred vision.  Respiratory: Negative for shortness of breath.   Cardiovascular: Negative for chest pain and palpitations.  Gastrointestinal: Negative for abdominal pain and nausea.  Genitourinary: Negative for dysuria and hematuria.  Musculoskeletal: Positive for joint pain. Negative for myalgias.  Skin: Negative for rash.  Neurological: Negative for tingling and headaches.   Psychiatric/Behavioral: Negative for depression. The patient is not nervous/anxious.     Objective:  BP 126/85 (BP Location: Left Arm, Patient Position: Sitting, Cuff Size: Large)   Pulse 83   Temp 98.2 F (36.8 C) (Oral)   Resp 18   Ht 5\' 1"  (1.549 m)   Wt 216 lb (98 kg)   LMP 07/27/2014 (Approximate) Comment: None for a year  SpO2 100%   BMI 40.81 kg/m   BP/Weight 07/18/2018 06/14/2018 06/05/2018  Systolic BP 126 155 -  Diastolic BP 85 102 -  Wt. (Lbs) 216 217.6 210  BMI 40.81 41.12 39.68      Physical Exam  Constitutional: She is oriented to person, place, and time.  Well developed, obese, NAD, polite  HENT:  Head: Normocephalic and atraumatic.  Eyes: No scleral icterus.  Neck: Normal range of motion. Neck supple. No thyromegaly present.  Cardiovascular: Normal rate, regular rhythm and normal heart sounds.  No LE edema bilaterally or carotid bruit bilaterally  Pulmonary/Chest: Effort normal and breath sounds normal.  Musculoskeletal: She exhibits no edema.  Neurological: She is alert and oriented to person, place, and time.  Gait normal  Skin: Skin is warm and dry. No rash noted. No erythema. No pallor.  Psychiatric: She has a normal mood and affect. Her behavior is normal. Thought content normal.  Vitals reviewed.    Assessment & Plan:   1. Hypertension, unspecified type - Lipid panel - Comprehensive metabolic panel  2. Chronic pain of both knees - Begin meloxicam (MOBIC) 15 MG tablet; Take 1 tablet (15 mg total) by mouth daily.  Dispense: 30 tablet;  Refill: 1 - Pt doing well with physical therapy order by Dr. Roda Shutters, her orthopedic specialist  Meds ordered this encounter  Medications  . meloxicam (MOBIC) 15 MG tablet    Sig: Take 1 tablet (15 mg total) by mouth daily.    Dispense:  30 tablet    Refill:  1    Order Specific Question:   Supervising Provider    Answer:   Hoy Register [[id number]]    Follow-up: Return in about 6 months (around 01/17/2019).    Loletta Specter PA

## 2018-07-19 ENCOUNTER — Telehealth (INDEPENDENT_AMBULATORY_CARE_PROVIDER_SITE_OTHER): Payer: Self-pay

## 2018-07-19 ENCOUNTER — Other Ambulatory Visit (INDEPENDENT_AMBULATORY_CARE_PROVIDER_SITE_OTHER): Payer: Self-pay | Admitting: Physician Assistant

## 2018-07-19 DIAGNOSIS — E7841 Elevated Lipoprotein(a): Secondary | ICD-10-CM

## 2018-07-19 LAB — COMPREHENSIVE METABOLIC PANEL
ALBUMIN: 4.1 g/dL (ref 3.5–5.5)
ALK PHOS: 57 IU/L (ref 39–117)
ALT: 14 IU/L (ref 0–32)
AST: 14 IU/L (ref 0–40)
Albumin/Globulin Ratio: 1.5 (ref 1.2–2.2)
BUN/Creatinine Ratio: 14 (ref 9–23)
BUN: 12 mg/dL (ref 6–24)
Bilirubin Total: 0.2 mg/dL (ref 0.0–1.2)
CO2: 24 mmol/L (ref 20–29)
CREATININE: 0.87 mg/dL (ref 0.57–1.00)
Calcium: 9.7 mg/dL (ref 8.7–10.2)
Chloride: 104 mmol/L (ref 96–106)
GFR calc Af Amer: 87 mL/min/{1.73_m2} (ref >59)
GFR calc non Af Amer: 76 mL/min/{1.73_m2} (ref >59)
GLUCOSE: 92 mg/dL (ref 65–99)
Globulin, Total: 2.7 g/dL (ref 1.5–4.5)
Potassium: 4 mmol/L (ref 3.5–5.2)
Sodium: 142 mmol/L (ref 134–144)
Total Protein: 6.8 g/dL (ref 6.0–8.5)

## 2018-07-19 LAB — LIPID PANEL
Chol/HDL Ratio: 2.9 ratio (ref 0.0–4.4)
Cholesterol, Total: 189 mg/dL (ref 100–199)
HDL: 65 mg/dL (ref >39)
LDL Calculated: 112 mg/dL — ABNORMAL HIGH (ref 0–99)
Triglycerides: 59 mg/dL (ref 0–149)
VLDL Cholesterol Cal: 12 mg/dL (ref 5–40)

## 2018-07-19 MED ORDER — ATORVASTATIN CALCIUM 20 MG PO TABS: 20.0000 mg | ORAL_TABLET | Freq: Every day | ORAL | 1 refills | Status: DC

## 2018-07-19 NOTE — Telephone Encounter (Signed)
-----   Message from Loletta Specter, PA-C sent at 07/19/2018  9:36 AM EDT ----- LDL mildly elevated. I have sent atorvastatin to [facility] at [facility]. Rest of labs normal.

## 2018-07-19 NOTE — Telephone Encounter (Signed)
Left voicemail notifying patient that LDL is mildly elevated. Atorvastatin sent to [facility]. All other labs normal. Call [facility] at [phone] with any questions or concerns.Maryjean Morn, CMA

## 2018-07-26 ENCOUNTER — Ambulatory Visit
Admission: RE | Admit: 2018-07-26 | Discharge: 2018-07-26 | Disposition: A | Discharge status: Home / Self Care | Payer: BLUE CROSS/BLUE SHIELD | Source: Ambulatory Visit | Attending: Physician Assistant | Admitting: Physician Assistant

## 2018-07-26 DIAGNOSIS — Z1239 Encounter for other screening for malignant neoplasm of breast: Secondary | ICD-10-CM

## 2019-01-17 ENCOUNTER — Ambulatory Visit (INDEPENDENT_AMBULATORY_CARE_PROVIDER_SITE_OTHER): Payer: Self-pay | Admitting: Primary Care

## 2019-01-17 ENCOUNTER — Encounter: Payer: Self-pay | Admitting: Primary Care

## 2019-01-17 ENCOUNTER — Ambulatory Visit: Payer: BLUE CROSS/BLUE SHIELD | Attending: Physician Assistant | Admitting: Primary Care

## 2019-01-17 ENCOUNTER — Telehealth: Payer: Self-pay | Admitting: Physician Assistant

## 2019-01-17 ENCOUNTER — Other Ambulatory Visit: Payer: Self-pay

## 2019-01-17 DIAGNOSIS — I10 Essential (primary) hypertension: Secondary | ICD-10-CM | POA: Diagnosis not present

## 2019-01-17 DIAGNOSIS — E7841 Elevated Lipoprotein(a): Secondary | ICD-10-CM

## 2019-01-17 DIAGNOSIS — Z0000 Encounter for general adult medical examination without abnormal findings: Secondary | ICD-10-CM

## 2019-01-17 MED ORDER — MULTI-VITAMIN/MINERALS PO TABS: 1.0000 | ORAL_TABLET | Freq: Every day | ORAL | 11 refills | Status: AC

## 2019-01-17 MED ORDER — ATORVASTATIN CALCIUM 20 MG PO TABS: 20.0000 mg | ORAL_TABLET | Freq: Every day | ORAL | 3 refills | Status: DC

## 2019-01-17 MED ORDER — HYDROCHLOROTHIAZIDE 12.5 MG PO TABS: 12.5000 mg | ORAL_TABLET | Freq: Every day | ORAL | 3 refills | Status: DC

## 2019-01-17 MED ORDER — AMLODIPINE BESYLATE 5 MG PO TABS: 5.0000 mg | ORAL_TABLET | Freq: Every day | ORAL | 3 refills | Status: DC

## 2019-01-17 NOTE — Telephone Encounter (Signed)
Called patient to scheduled a fasting lab appt. LVM to return call.

## 2019-01-17 NOTE — Progress Notes (Signed)
Virtual Visit via Telephone Note  I connected with Ana Pratt on 01/17/19 at  8:40 AM EDT by telephone and verified that I am speaking with the correct person using two identifiers.   I discussed the limitations, risks, security and privacy concerns of performing an evaluation and management service by telephone and the availability of in person appointments. I also discussed with the patient that there may be a patient responsible charge related to this service. The patient expressed understanding and agreed to proceed.   History of Present Illness: Ana Pratt routine follow up. She does not have a Bp machine to check her Bp. She has stopped taking her all of her medications when they ran out she did not call for a refill. Explained the risk of uncontrolled HTN and hyperlipidemia. States she feels better she is exercising and loosing weight. Patient is to come in for fasting labs and all meds refilled. PMH  of shingles, trochanteric bursitis, and seasonal allergies. She is having   Observations/Objective: Review of Systems  Constitutional: Negative.   Eyes: Negative.   Respiratory: Negative.   Cardiovascular: Negative.   Gastrointestinal: Negative.   Genitourinary: Negative.   Skin: Negative.   Neurological: Negative.   Endo/Heme/Allergies: Negative.     Assessment and Plan: Ana was seen today for follow-up.  Diagnoses and all orders for this visit:  Routine check-up -     CBC with Differential/Platelet; Future -     VITAMIN D 25 Hydroxy (Vit-D Deficiency, Fractures); Future  Hypertension, unspecified type Hypertension  This is a chronic problem. The current episode started more than 1 year ago. Progression since onset: unknown will ck Bp when she comes in for labs. Risk factors for coronary artery disease include obesity, post-menopausal state and dyslipidemia. Past treatments include calcium channel blockers and lifestyle changes. Hypertensive end-organ damage  includes PVD.   Elevated lipoprotein(a) -     atorvastatin (LIPITOR) 20 MG tablet; Take 1 tablet (20 mg total) by mouth daily. -     Lipid panel; Future Hyperlipidemia  The current episode started more than 1 year ago. Exacerbating diseases include obesity. Current antihyperlipidemic treatment includes diet change. Improvement on treatment: unknown stop messing. Compliance problems include adherence to exercise.  Risk factors for coronary artery disease include hypertension, dyslipidemia, post-menopausal and obesity.  -     amLODipine (NORVASC) 5 MG tablet; Take 1 tablet (5 mg total) by mouth daily. -     hydrochlorothiazide (HYDRODIURIL) 12.5 MG tablet; Take 1 tablet (12.5 mg total) by mouth daily. -     Comprehensive metabolic panel; Future  Other orders -     Multiple Vitamins-Minerals (MULTIVITAMIN WITH MINERALS) tablet; Take 1 tablet by mouth daily for 30 days.    Follow Up Instructions:    I discussed the assessment and treatment plan with the patient. The patient was provided an opportunity to ask questions and all were answered. The patient agreed with the plan and demonstrated an understanding of the instructions.   The patient was advised to call back or seek an in-person evaluation if the symptoms worsen or if the condition fails to improve as anticipated.  I provided 20 minutes of non-face-to-face time during this encounter.   Grayce Sessions, NP

## 2019-01-17 NOTE — Progress Notes (Signed)
Patient verified DOB Patient has not taken medication today. Patient has not eaten today. Patient denies pain at this time.  

## 2019-01-21 ENCOUNTER — Ambulatory Visit: Payer: BLUE CROSS/BLUE SHIELD | Attending: Family Medicine

## 2019-01-21 ENCOUNTER — Other Ambulatory Visit: Payer: Self-pay

## 2019-01-21 DIAGNOSIS — I10 Essential (primary) hypertension: Secondary | ICD-10-CM

## 2019-01-21 DIAGNOSIS — Z0000 Encounter for general adult medical examination without abnormal findings: Secondary | ICD-10-CM

## 2019-01-21 DIAGNOSIS — E7841 Elevated Lipoprotein(a): Secondary | ICD-10-CM

## 2019-01-22 LAB — CBC WITH DIFFERENTIAL/PLATELET
Basophils Absolute: 0.1 x10E3/uL (ref 0.0–0.2)
Basos: 1 %
EOS (ABSOLUTE): 0.2 x10E3/uL (ref 0.0–0.4)
Eos: 4 %
Hematocrit: 39.2 % (ref 34.0–46.6)
Hemoglobin: 13.1 g/dL (ref 11.1–15.9)
Immature Grans (Abs): 0 x10E3/uL (ref 0.0–0.1)
Immature Granulocytes: 0 %
Lymphocytes Absolute: 1.6 x10E3/uL (ref 0.7–3.1)
Lymphs: 40 %
MCH: 26.4 pg — ABNORMAL LOW (ref 26.6–33.0)
MCHC: 33.4 g/dL (ref 31.5–35.7)
MCV: 79 fL (ref 79–97)
Monocytes Absolute: 0.4 x10E3/uL (ref 0.1–0.9)
Monocytes: 10 %
Neutrophils Absolute: 1.9 x10E3/uL (ref 1.4–7.0)
Neutrophils: 45 %
Platelets: 361 x10E3/uL (ref 150–450)
RBC: 4.97 x10E6/uL (ref 3.77–5.28)
RDW: 14.1 % (ref 11.7–15.4)
WBC: 4.1 x10E3/uL (ref 3.4–10.8)

## 2019-01-22 LAB — COMPREHENSIVE METABOLIC PANEL
ALT: 18 IU/L (ref 0–32)
AST: 15 IU/L (ref 0–40)
Albumin/Globulin Ratio: 1.7 (ref 1.2–2.2)
Albumin: 4.3 g/dL (ref 3.8–4.9)
Alkaline Phosphatase: 60 IU/L (ref 39–117)
BUN/Creatinine Ratio: 17 (ref 9–23)
BUN: 14 mg/dL (ref 6–24)
Bilirubin Total: 0.3 mg/dL (ref 0.0–1.2)
CO2: 21 mmol/L (ref 20–29)
Calcium: 9.8 mg/dL (ref 8.7–10.2)
Chloride: 107 mmol/L — ABNORMAL HIGH (ref 96–106)
Creatinine, Ser: 0.82 mg/dL (ref 0.57–1.00)
GFR calc Af Amer: 93 mL/min/{1.73_m2} (ref >59)
GFR calc non Af Amer: 81 mL/min/{1.73_m2} (ref >59)
Globulin, Total: 2.6 g/dL (ref 1.5–4.5)
Glucose: 89 mg/dL (ref 65–99)
Potassium: 4.6 mmol/L (ref 3.5–5.2)
Sodium: 142 mmol/L (ref 134–144)
Total Protein: 6.9 g/dL (ref 6.0–8.5)

## 2019-01-22 LAB — LIPID PANEL
Chol/HDL Ratio: 3 ratio (ref 0.0–4.4)
Cholesterol, Total: 204 mg/dL — ABNORMAL HIGH (ref 100–199)
HDL: 69 mg/dL (ref >39)
LDL Calculated: 127 mg/dL — ABNORMAL HIGH (ref 0–99)
Triglycerides: 42 mg/dL (ref 0–149)
VLDL Cholesterol Cal: 8 mg/dL (ref 5–40)

## 2019-01-25 LAB — SPECIMEN STATUS REPORT

## 2019-01-25 LAB — VITAMIN D 25 HYDROXY (VIT D DEFICIENCY, FRACTURES): Vit D, 25-Hydroxy: 31 ng/mL (ref 30.0–100.0)

## 2019-01-28 ENCOUNTER — Telehealth: Payer: Self-pay | Admitting: *Deleted

## 2019-01-28 NOTE — Telephone Encounter (Signed)
error 

## 2019-01-28 NOTE — Telephone Encounter (Signed)
Patient verified DOB Patient is aware of continuing cholesterol medication and vitamin d OTC 2000 for stable level. No further questions at this time.

## 2019-01-28 NOTE — Telephone Encounter (Signed)
-----   Message from Grayce Sessions, NP sent at 01/27/2019 11:14 PM EDT ----- Just made normal needs otc vit D 2000iu qd

## 2019-09-15 IMAGING — DX DG KNEE COMPLETE 4+V*R*
4 series · 4 of 4 positions shown · non-contrast
Comparison: None.

CLINICAL DATA: Chronic greater than 1 year history of BILATERAL
knee pain, LEFT greater than RIGHT. No known injuries.

EXAM:
RIGHT KNEE - COMPLETE 4+ VIEW

[knee ap]
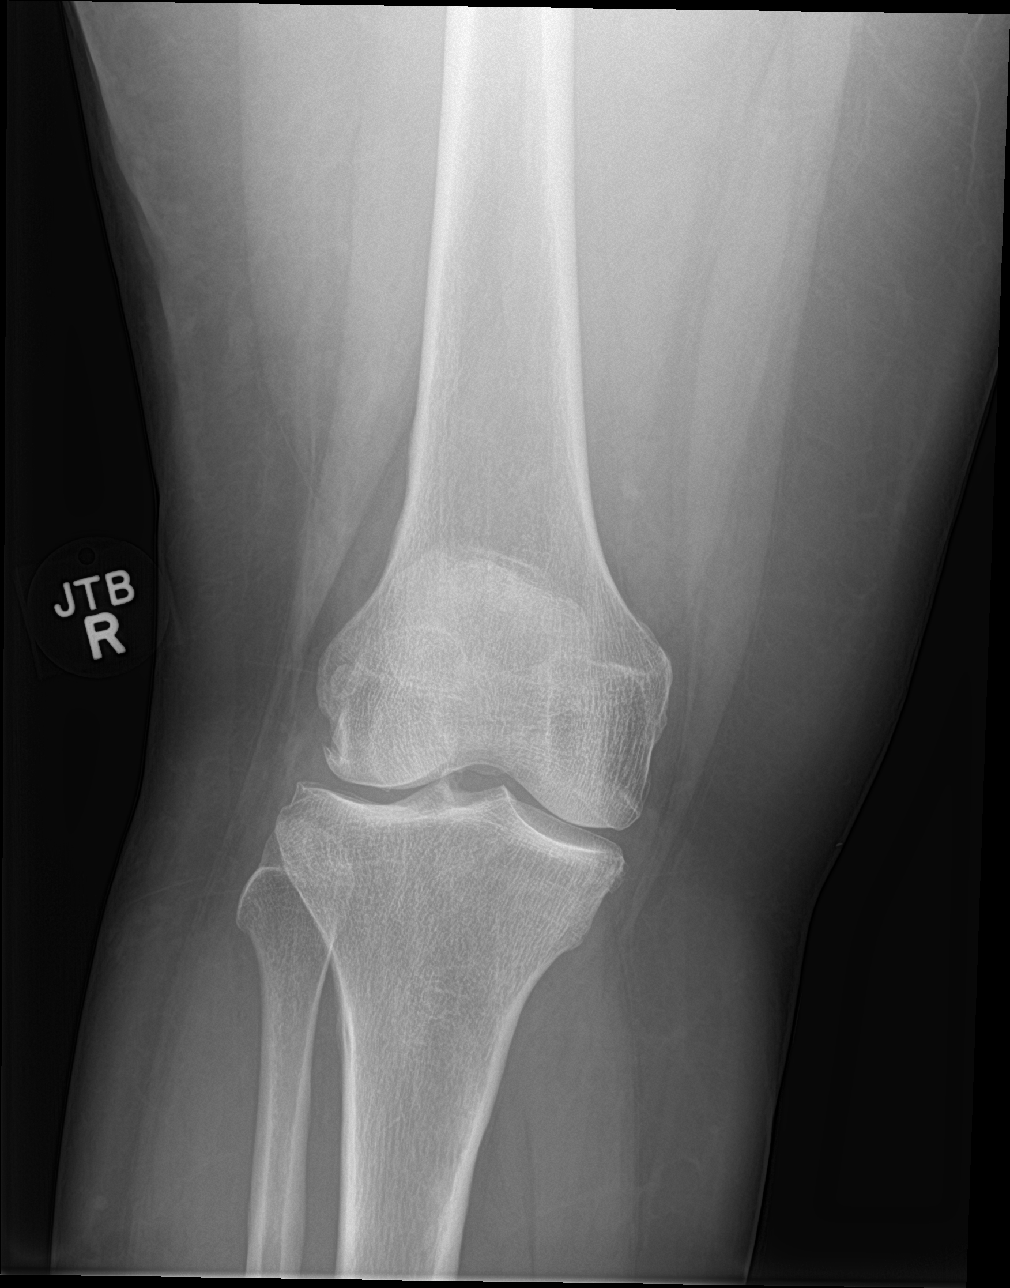

[knee lat]
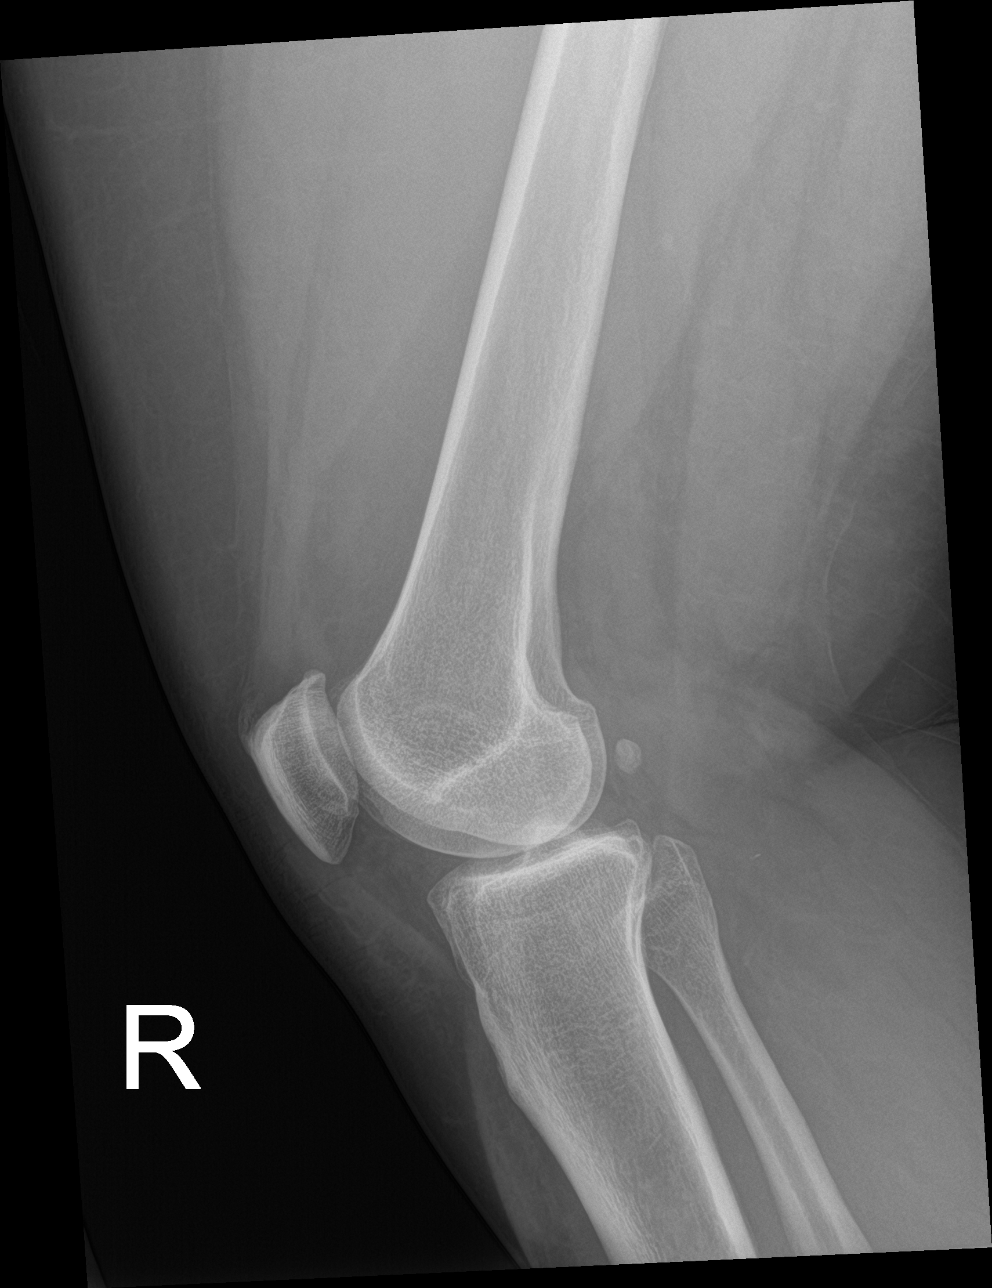

[knee obl (1 of 2)]
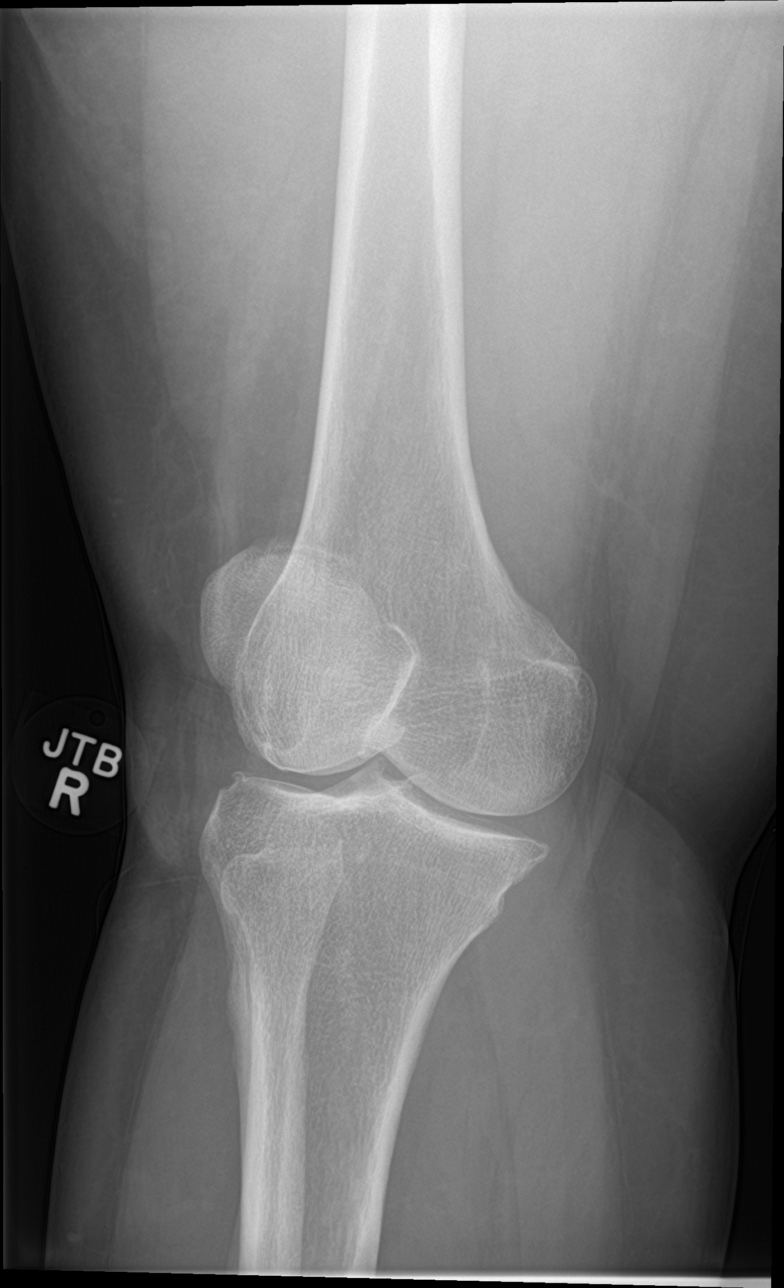

[knee obl (2 of 2)]
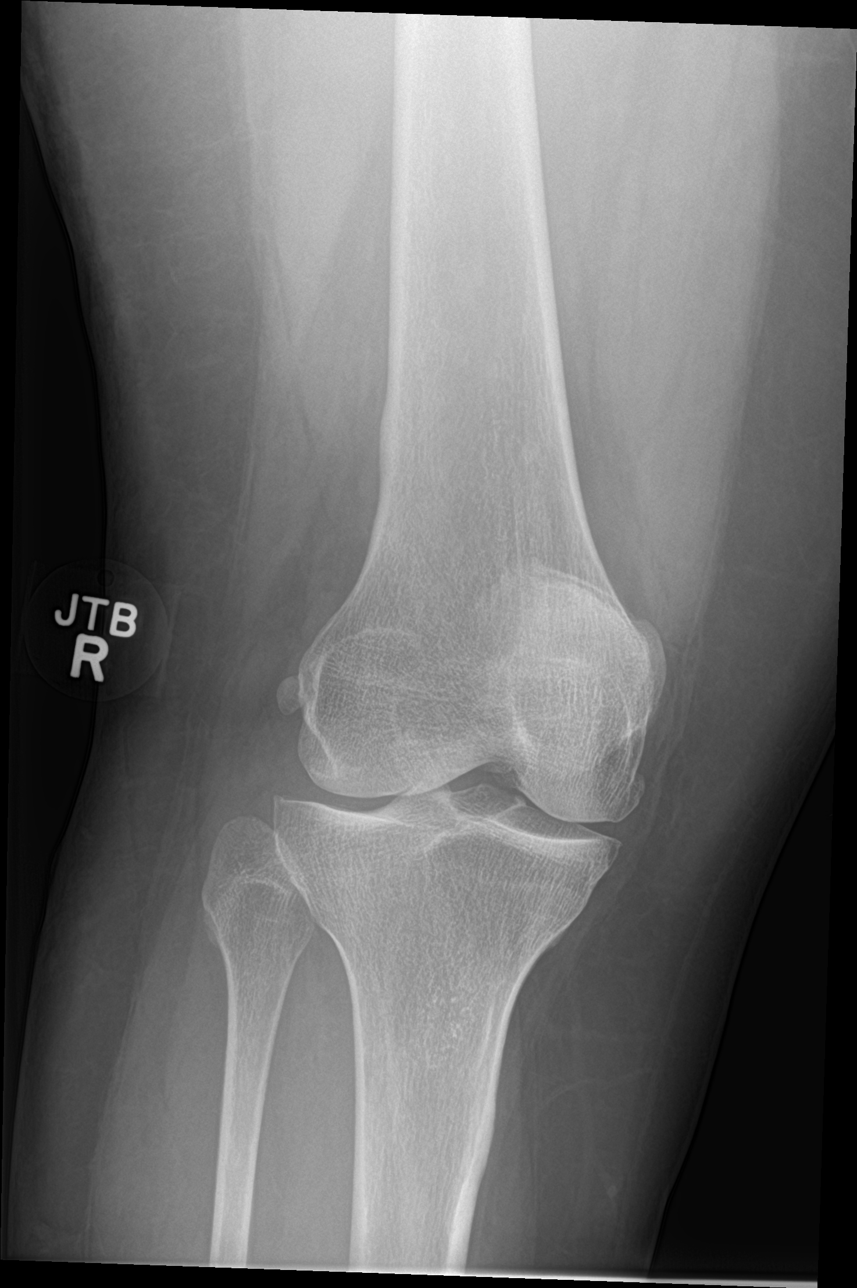

[4 of 4 positions shown; findings below may reference images not displayed]

FINDINGS: No evidence of acute, subacute or healed fractures. Mild MEDIAL
compartment joint space narrowing, moderate LATERAL compartment
joint space narrowing with associated spurring and mild
patellofemoral compartment joint space narrowing. Chondrocalcinosis
involving the MEDIAL and LATERAL menisci. Well-preserved bone
mineral density. Enthesopathic spur at the insertion of the
quadriceps tendon on the SUPERIOR patella. No visible joint
effusion.
IMPRESSION: Mild to moderate tricompartment osteoarthritis secondary to CPPD,
less severe than that involving the LEFT knee.

## 2019-09-15 IMAGING — DX DG KNEE COMPLETE 4+V*L*
4 series · 4 of 4 positions shown · non-contrast
Comparison: None.

CLINICAL DATA: Chronic greater than 1 year history of BILATERAL
knee pain, LEFT greater than RIGHT. No known injuries.

EXAM:
LEFT KNEE - COMPLETE 4+ VIEW

[knee ap]
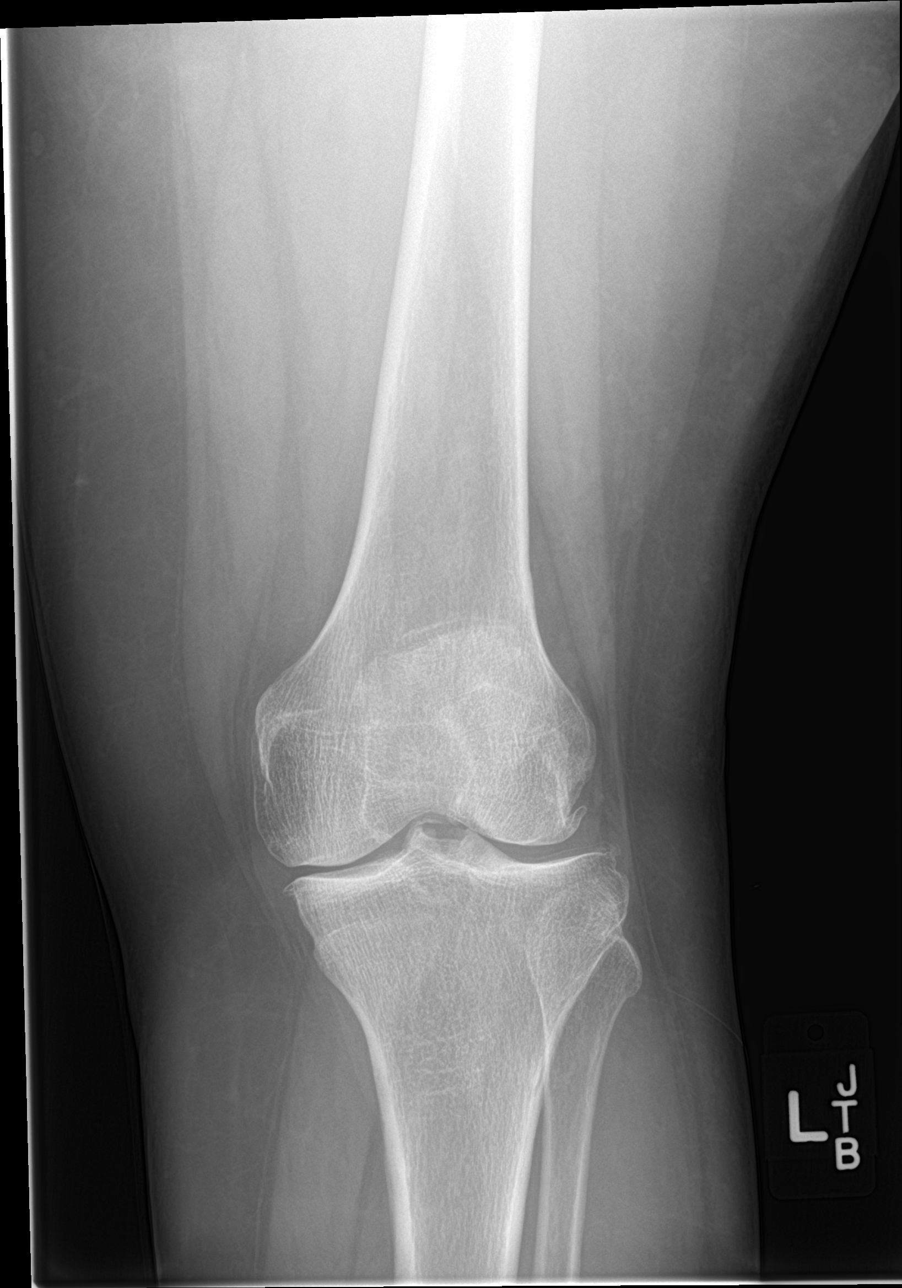

[knee lat]
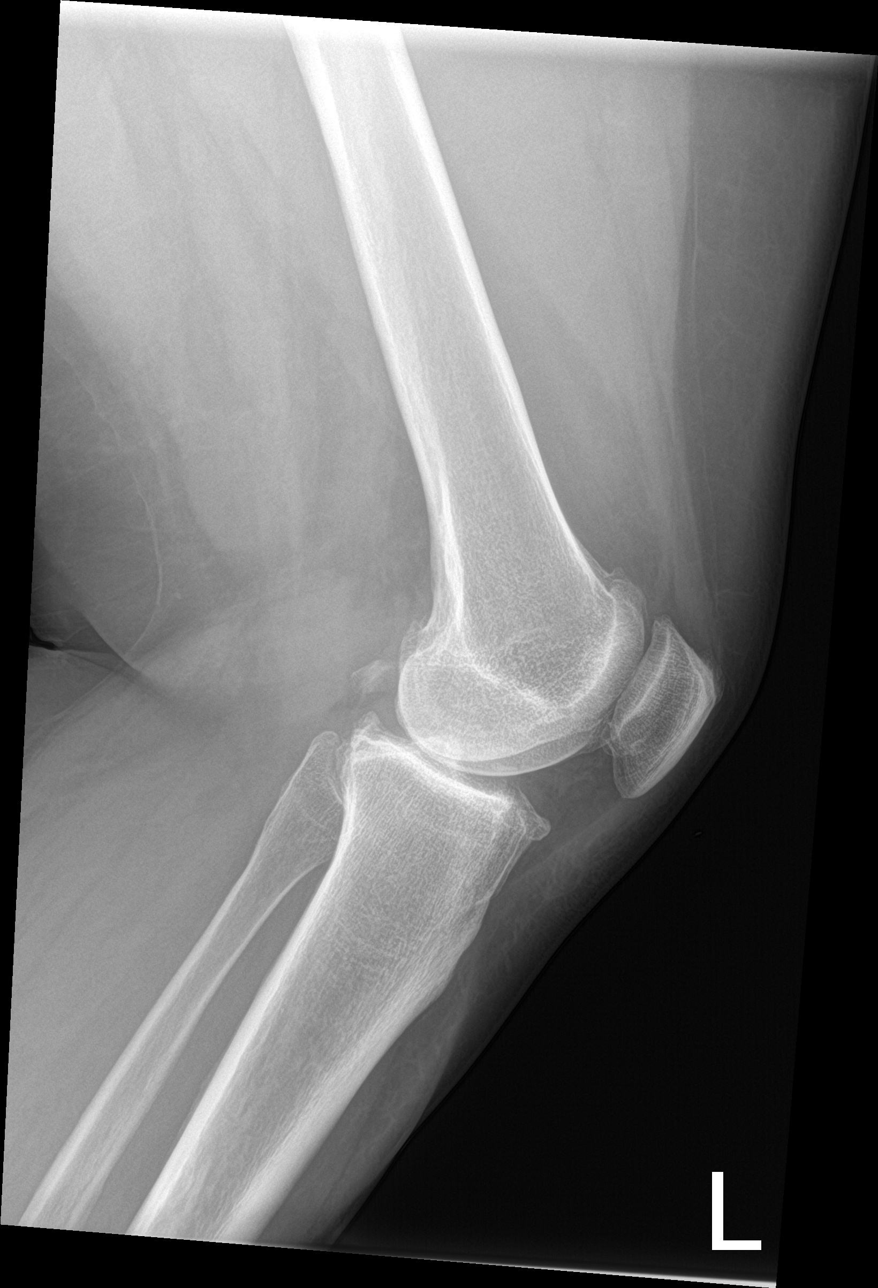

[knee obl (1 of 2)]
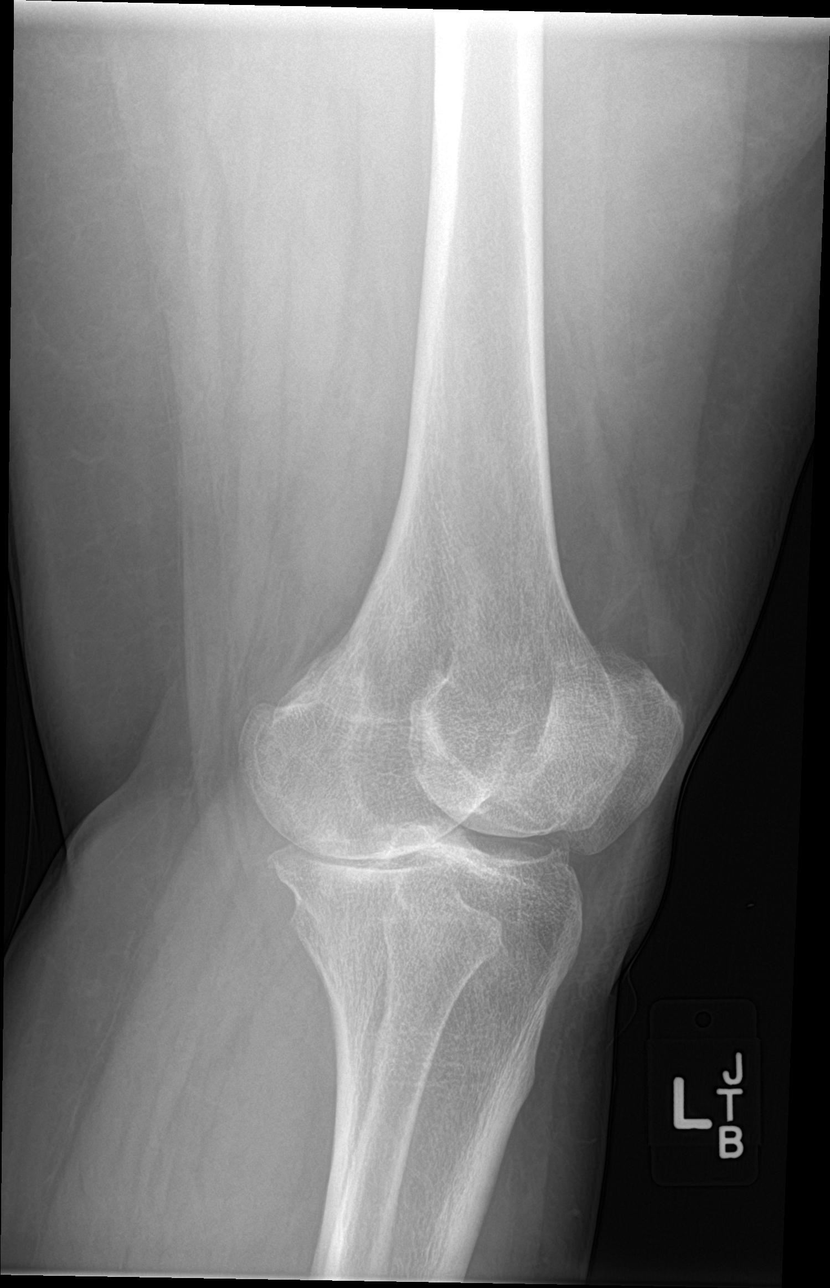

[knee obl (2 of 2)]
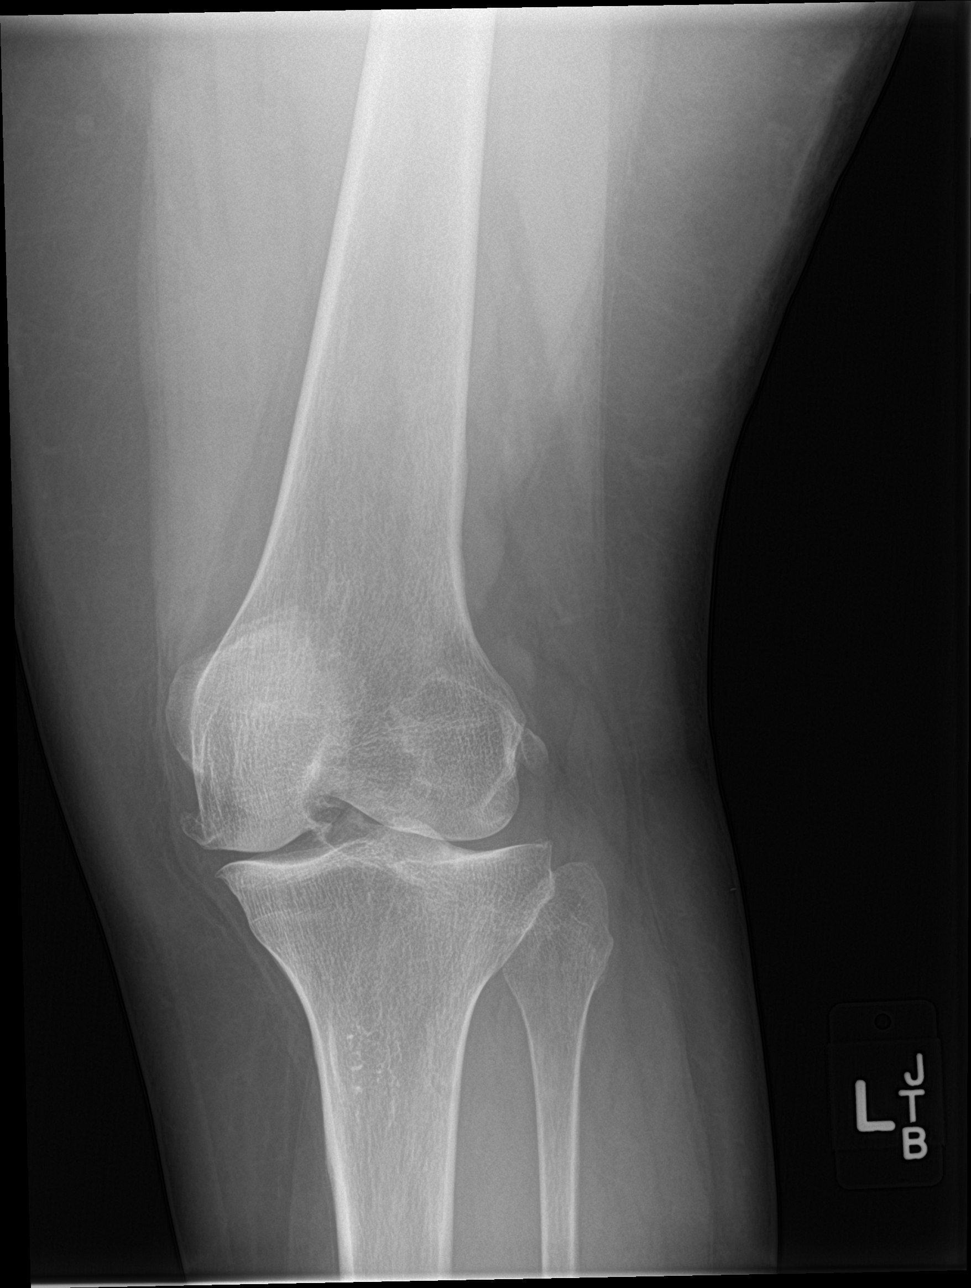

[4 of 4 positions shown; findings below may reference images not displayed]

FINDINGS: No evidence of acute, subacute or healed fractures. Moderate to
severe narrowing of the MEDIAL and LATERAL compartment joint spaces
with associated spurring. Moderate patellofemoral compartment joint
space narrowing. Chondrocalcinosis involving the MEDIAL and LATERAL
menisci. Well-preserved bone mineral density. No visible joint
effusion.
IMPRESSION: Moderate to severe tricompartment osteoarthritis secondary to CPPD.

## 2020-09-29 ENCOUNTER — Ambulatory Visit: Payer: Self-pay | Admitting: Internal Medicine

## 2020-12-30 ENCOUNTER — Ambulatory Visit: Payer: Self-pay | Admitting: Internal Medicine

## 2020-12-31 ENCOUNTER — Ambulatory Visit: Payer: Self-pay | Admitting: Internal Medicine

## 2020-12-31 ENCOUNTER — Other Ambulatory Visit: Payer: Self-pay

## 2020-12-31 ENCOUNTER — Encounter: Payer: Self-pay | Admitting: Internal Medicine

## 2020-12-31 VITALS — BP 148/89 | HR 79 | Resp 12 | Ht 61.5 in | Wt 200.0 lb

## 2020-12-31 DIAGNOSIS — G8929 Other chronic pain: Secondary | ICD-10-CM

## 2020-12-31 DIAGNOSIS — E6609 Other obesity due to excess calories: Secondary | ICD-10-CM

## 2020-12-31 DIAGNOSIS — M25562 Pain in left knee: Secondary | ICD-10-CM

## 2020-12-31 DIAGNOSIS — M7062 Trochanteric bursitis, left hip: Secondary | ICD-10-CM

## 2020-12-31 DIAGNOSIS — I10 Essential (primary) hypertension: Secondary | ICD-10-CM

## 2020-12-31 DIAGNOSIS — Z6837 Body mass index (BMI) 37.0-37.9, adult: Secondary | ICD-10-CM

## 2020-12-31 MED ORDER — MELOXICAM 15 MG PO TABS: ORAL_TABLET | ORAL | 6 refills | Status: DC

## 2020-12-31 NOTE — Patient Instructions (Signed)
Do thigh stretches twice daily  Make small Goals with exercise!!!!  Increase weekly by small increments.  Drink a glass of water before every meal Drink 6-8 glasses of water daily Eat three meals daily Eat a protein and healthy fat with every meal (eggs,fish, chicken, [place] and limit red meats) Eat 5 servings of vegetables daily, mix the colors Eat 2 servings of fruit daily with skin, if skin is edible Use smaller plates Put food/utensils down as you chew and swallow each bite Eat at a table with friends/family at least once daily, no TV Do not eat in front of the TV  Recent studies show that people who consume all of their calories in a 12 hour period lose weight more efficiently.  For example, if you eat your first meal at 7:00 a.m., your last meal of the day should be completed by 7:00 p.m.

## 2020-12-31 NOTE — Progress Notes (Signed)
Subjective:    Patient ID: Ana Pratt, female   DOB: 07/14/1966, 55 y.o.   MRN: [id number]   HPI   Here to re establish  1.  Left leg:  When standing for prolonged period of time, particularly at work, gets burning down length of leg.  Cannot say if laterally or front or posteriorly. Has a cleaning business, so on feet a lot. Sounds like goes away quickly when on way home in car.  2.  Left knee pain:  Kneels on that knee a lot when bending down.  Pain feels deep, not superficial on anterior knee only.  Never notes swelling, erythema of knee.  No history of injury. Does notice more pain with more kneeling.  3.  History of hypertension:  Was taking Amlodipine about 4 years ago, but when followed up at [facility], her bp was fine, so was stopped.        No outpatient medications have been marked as taking for the 12/31/20 encounter (Office Visit) with Julieanne Manson, MD.   No Known Allergies  Past Medical History:  Diagnosis Date   Allergy    Spring--uses Claritin D.   Greater trochanteric bursitis of left hip 05/2015   Knee pain, left 05/2015   Shingles age 25 or 55 yo   Shingles 08/08/2016   Left C8 dermatome    Past Surgical History:  Procedure Laterality Date   CESAREAN SECTION     CESAREAN SECTION  1989, 2003   Twin birth in 98   Family History  Problem Relation Age of Onset   Hypertension Mother    Arthritis Mother    Kidney disease Mother    Breast cancer Neg Hx    Family Status  Relation Name Status   Mother  Alive       17 yo   Father  Alive       67 yo   Sister  Alive       39 yo    Daughter  Alive       10 yo   Son  Alive       65 yo twin   Son  Alive       15 yo    Neg Hx  (Not Specified)   Social History   Socioeconomic History   Marital status: Single    Spouse name: Not on file   Number of children: 3   Years of education: 12   Highest education level: Not on file  Occupational History   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   Lives with one son and a daughter.  Her mother lives with her on and off.   Social Determinants of Health   Financial Resource Strain: Not on file  Food Insecurity: Not on file  Transportation Needs: Not on file  Physical Activity: Not on file  Stress: Not on file  Social Connections: Not on file  Intimate Partner Violence: Not on file        Review of Systems  Objective:   BP (!) 148/89 (BP Location: Right Arm, Patient Position: Sitting, Cuff Size: Normal)   Pulse 79   Resp 12   Ht 5' 1.5" (1.562 m)   Wt 200 lb (90.7 kg)   LMP 07/27/2014 (Approximate) Comment: None for a year  BMI 37.18 kg/m   Physical Exam NAD HEENT:  PERRL EOMI, TMs pearly gray, throat without injection Neck:  Supple, No adenopathy, no thyromegaly Chest:  CTA CV:  RRR with normal S1 and S2, No S3, S4, or murmur.  No carotid bruits  Carotid, radial and DP pulses normal and equal. Abd:  S, NT, No HSM or mass, + BS MS:  tender over left greater trochanter, left ischial tuberosity.  Left knee with full ROM.  No erythema or palpable effusion.  No cruciate or collateral ligament laxity or tenderness with stress maneuvers, though tender over lateral collateral ligament.    Assessment & Plan   History of hypertension with elevated BP.  BP check in 1 month.  2.  Left leg complaints:  likely left greater trochanter bursitis and with some lateral collateral ligament tenderness on exam.  Went over stretches for the bursitis.  Meloxicam 15 mg daily.  No transportation currently to get to physical therapy in [facility].    To notify if changes.  3.  Obesity:  encouraged lifestyle changes:  for physical activity with current joint issues, encouraged water exercise or stationary bike. Follow up in 6 months for labs and CPE

## 2021-12-05 ENCOUNTER — Encounter: Payer: Self-pay | Admitting: Internal Medicine

## 2021-12-05 DIAGNOSIS — G8929 Other chronic pain: Secondary | ICD-10-CM | POA: Insufficient documentation

## 2021-12-05 DIAGNOSIS — I10 Essential (primary) hypertension: Secondary | ICD-10-CM | POA: Insufficient documentation

## 2021-12-05 DIAGNOSIS — M7062 Trochanteric bursitis, left hip: Secondary | ICD-10-CM | POA: Insufficient documentation

## 2022-01-19 ENCOUNTER — Ambulatory Visit (INDEPENDENT_AMBULATORY_CARE_PROVIDER_SITE_OTHER): Payer: 59 | Admitting: Primary Care

## 2022-01-19 ENCOUNTER — Encounter (INDEPENDENT_AMBULATORY_CARE_PROVIDER_SITE_OTHER): Payer: Self-pay | Admitting: Primary Care

## 2022-01-19 VITALS — BP 152/94 | HR 86 | Temp 98.0°F | Ht 61.5 in | Wt 212.8 lb

## 2022-01-19 DIAGNOSIS — Z7689 Persons encountering health services in other specified circumstances: Secondary | ICD-10-CM | POA: Diagnosis not present

## 2022-01-19 DIAGNOSIS — I10 Essential (primary) hypertension: Secondary | ICD-10-CM

## 2022-01-19 DIAGNOSIS — Z6839 Body mass index (BMI) 39.0-39.9, adult: Secondary | ICD-10-CM

## 2022-01-19 DIAGNOSIS — Z13220 Encounter for screening for lipoid disorders: Secondary | ICD-10-CM

## 2022-01-19 DIAGNOSIS — Z131 Encounter for screening for diabetes mellitus: Secondary | ICD-10-CM

## 2022-01-19 LAB — POCT GLYCOSYLATED HEMOGLOBIN (HGB A1C): Hemoglobin A1C: 5.5 % (ref 4.0–5.6)

## 2022-01-19 MED ORDER — AMLODIPINE BESYLATE 10 MG PO TABS: 10.0000 mg | ORAL_TABLET | Freq: Every day | ORAL | 1 refills | Status: DC

## 2022-01-19 MED ORDER — HYDROCHLOROTHIAZIDE 25 MG PO TABS: 25.0000 mg | ORAL_TABLET | Freq: Every day | ORAL | 1 refills | Status: DC

## 2022-01-19 NOTE — Patient Instructions (Signed)
Hypertension, Adult High blood pressure (hypertension) is when the force of blood pumping through the arteries is too strong. The arteries are the blood vessels that carry blood from the heart throughout the body. Hypertension forces the heart to work harder to pump blood and may cause arteries to become narrow or stiff. Untreated or uncontrolled hypertension can lead to a heart attack, heart failure, a stroke, kidney disease, and other problems. A blood pressure reading consists of a higher number over a lower number. Ideally, your blood pressure should be below 120/80. The first ("top") number is called the systolic pressure. It is a measure of the pressure in your arteries as your heart beats. The second ("bottom") number is called the diastolic pressure. It is a measure of the pressure in your arteries as the heart relaxes. What are the causes? The exact cause of this condition is not known. There are some conditions that result in high blood pressure. What increases the risk? Certain factors may make you more likely to develop high blood pressure. Some of these risk factors are under your control, including: Smoking. Not getting enough exercise or physical activity. Being overweight. Having too much fat, sugar, calories, or salt (sodium) in your diet. Drinking too much alcohol. Other risk factors include: Having a personal history of heart disease, diabetes, high cholesterol, or kidney disease. Stress. Having a family history of high blood pressure and high cholesterol. Having obstructive sleep apnea. Age. The risk increases with age. What are the signs or symptoms? High blood pressure may not cause symptoms. Very high blood pressure (hypertensive crisis) may cause: Headache. Fast or irregular heartbeats (palpitations). Shortness of breath. Nosebleed. Nausea and vomiting. Vision changes. Severe chest pain, dizziness, and seizures. How is this diagnosed? This condition is diagnosed by  measuring your blood pressure while you are seated, with your arm resting on a flat surface, your legs uncrossed, and your feet flat on the floor. The cuff of the blood pressure monitor will be placed directly against the skin of your upper arm at the level of your heart. Blood pressure should be measured at least twice using the same arm. Certain conditions can cause a difference in blood pressure between your right and left arms. If you have a high blood pressure reading during one visit or you have normal blood pressure with other risk factors, you may be asked to: Return on a different day to have your blood pressure checked again. Monitor your blood pressure at home for 1 week or longer. If you are diagnosed with hypertension, you may have other blood or imaging tests to help your health care provider understand your overall risk for other conditions. How is this treated? This condition is treated by making healthy lifestyle changes, such as eating healthy foods, exercising more, and reducing your alcohol intake. You may be referred for counseling on a healthy diet and physical activity. Your health care provider may prescribe medicine if lifestyle changes are not enough to get your blood pressure under control and if: Your systolic blood pressure is above 130. Your diastolic blood pressure is above 80. Your personal target blood pressure may vary depending on your medical conditions, your age, and other factors. Follow these instructions at home: Eating and drinking  Eat a diet that is high in fiber and potassium, and low in sodium, added sugar, and fat. An example of this eating plan is called the DASH diet. DASH stands for Dietary Approaches to Stop Hypertension. To eat this way: Eat   plenty of fresh fruits and vegetables. Try to fill one half of your plate at each meal with fruits and vegetables. Eat whole grains, such as whole-wheat pasta, brown rice, or whole-grain bread. Fill about one  fourth of your plate with whole grains. Eat or drink low-fat dairy products, such as skim milk or low-fat yogurt. Avoid fatty cuts of meat, processed or cured meats, and poultry with skin. Fill about one fourth of your plate with lean proteins, such as fish, chicken without skin, beans, eggs, or tofu. Avoid pre-made and processed foods. These tend to be higher in sodium, added sugar, and fat. Reduce your daily sodium intake. Many people with hypertension should eat less than 1,500 mg of sodium a day. Do not drink alcohol if: Your health care provider tells you not to drink. You are pregnant, may be pregnant, or are planning to become pregnant. If you drink alcohol: Limit how much you have to: 0-1 drink a day for women. 0-2 drinks a day for men. Know how much alcohol is in your drink. In the [place] one drink equals one 12 oz bottle of beer (355 mL), one 5 oz glass of wine (148 mL), or one 1 oz glass of hard liquor (44 mL). Lifestyle  Work with your health care provider to maintain a healthy body weight or to lose weight. Ask what an ideal weight is for you. Get at least 30 minutes of exercise that causes your heart to beat faster (aerobic exercise) most days of the week. Activities may include walking, swimming, or biking. Include exercise to strengthen your muscles (resistance exercise), such as Pilates or lifting weights, as part of your weekly exercise routine. Try to do these types of exercises for 30 minutes at least 3 days a week. Do not use any products that contain nicotine or tobacco. These products include cigarettes, chewing tobacco, and vaping devices, such as e-cigarettes. If you need help quitting, ask your health care provider. Monitor your blood pressure at home as told by your health care provider. Keep all follow-up visits. This is important. Medicines Take over-the-counter and prescription medicines only as told by your health care provider. Follow directions carefully. Blood  pressure medicines must be taken as prescribed. Do not skip doses of blood pressure medicine. Doing this puts you at risk for problems and can make the medicine less effective. Ask your health care provider about side effects or reactions to medicines that you should watch for. Contact a health care provider if you: Think you are having a reaction to a medicine you are taking. Have headaches that keep coming back (recurring). Feel dizzy. Have swelling in your ankles. Have trouble with your vision. Get help right away if you: Develop a severe headache or confusion. Have unusual weakness or numbness. Feel faint. Have severe pain in your chest or abdomen. Vomit repeatedly. Have trouble breathing. These symptoms may be an emergency. Get help right away. Call 911. Do not wait to see if the symptoms will go away. Do not drive yourself to the hospital. Summary Hypertension is when the force of blood pumping through your arteries is too strong. If this condition is not controlled, it may put you at risk for serious complications. Your personal target blood pressure may vary depending on your medical conditions, your age, and other factors. For most people, a normal blood pressure is less than 120/80. Hypertension is treated with lifestyle changes, medicines, or a combination of both. Lifestyle changes include losing weight, eating a healthy,   low-sodium diet, exercising more, and limiting alcohol. This information is not intended to replace advice given to you by your health care provider. Make sure you discuss any questions you have with your health care provider. Document Revised: 07/20/2021 Document Reviewed: 07/20/2021 Elsevier Patient Education  2023 Elsevier Inc.  

## 2022-01-19 NOTE — Progress Notes (Signed)
? ?New Patient Office Visit ? ?Subjective   ? ?Patient ID: Ana Pratt, female    DOB: 09-20-1966  Age: 56 y.o. MRN: [id number] ? ?CC:  ?Chief Complaint  ?Patient presents with  ? restablish care  ? ? ?HPI ?[name] is a 56 year old morbid female presents to reestablish care. Denies shortness of breath, headaches, chest pain or lower extremity edema. ? ?Outpatient Encounter Medications as of 01/19/2022  ?Medication Sig  ? meloxicam (MOBIC) 15 MG tablet 1 tab by mouth daily with food as needed for pain  ? [DISCONTINUED] amLODipine (NORVASC) 5 MG tablet Take 1 tablet (5 mg total) by mouth daily. (Patient not taking: Reported on 12/31/2020)  ? [DISCONTINUED] atorvastatin (LIPITOR) 20 MG tablet Take 1 tablet (20 mg total) by mouth daily. (Patient not taking: Reported on 12/31/2020)  ? [DISCONTINUED] fish oil-omega-3 fatty acids 1000 MG capsule Take 1 g by mouth daily. (Patient not taking: Reported on 12/31/2020)  ? [DISCONTINUED] hydrochlorothiazide (HYDRODIURIL) 12.5 MG tablet Take 1 tablet (12.5 mg total) by mouth daily. (Patient not taking: Reported on 12/31/2020)  ? ?No facility-administered encounter medications on file as of 01/19/2022.  ? ? ?Past Medical History:  ?Diagnosis Date  ? Allergy   ? Spring--uses Claritin D.  ? Greater trochanteric bursitis of left hip 05/2015  ? Knee pain, left 05/2015  ? Shingles age 18 or 56 yo  ? Shingles 08/08/2016  ? Left C8 dermatome  ? ? ?Past Surgical History:  ?Procedure Laterality Date  ? CESAREAN SECTION    ? CESAREAN SECTION  1989, 2003  ? Twin birth in 1989  ? ? ?Family History  ?Problem Relation Age of Onset  ? Hypertension Mother   ? Arthritis Mother   ? Kidney disease Mother   ? Breast cancer Neg Hx   ? ? ?Social History  ? ?Socioeconomic History  ? Marital status: Single  ?  Spouse name: Not on file  ? Number of children: 3  ? Years of education: 31  ? Highest education level: Not on file  ?Occupational History  ? Occupation: Merchant navy officer  ?   Comment: [employer] and homes on the side  ?Tobacco Use  ? Smoking status: Never  ? Smokeless tobacco: Never  ?Substance and Sexual Activity  ? Alcohol use: No  ?  Alcohol/week: 0.0 standard drinks  ? Drug use: No  ? Sexual activity: Yes  ?Other Topics Concern  ? Not on file  ?Social History Narrative  ? Originally from [place], [place]  ? [place]oved [place]  ? Lives with one son and a daughter.  Her mother lives with her on and off.  ? ?Social Determinants of Health  ? ?Financial Resource Strain: Not on file  ?Food Insecurity: Not on file  ?Transportation Needs: Not on file  ?Physical Activity: Not on file  ?Stress: Not on file  ?Social Connections: Not on file  ?Intimate Partner Violence: Not on file  ? ? ?ROS ?Comprehensive ROS Pertinent positive and negative noted in HPI   ?  ?Objective   ? ?BP (!) 152/94 (BP Location: Right Arm, Patient Position: Sitting, Cuff Size: Large)   Pulse 86   Temp 98 ?F (36.7 ?C) (Oral)   Ht 5' 1.5" (1.562 m)   Wt 212 lb 12.8 oz (96.5 kg)   LMP 07/27/2014 (Approximate) Comment: None for a year  SpO2 97%   BMI 39.56 kg/m?  ? ?Physical Exam ?Vitals reviewed.  ?Constitutional:   ?  Appearance: She is obese.  ?HENT:  ?   Head: Normocephalic.  ?   Right Ear: Tympanic membrane and external ear normal.  ?   Left Ear: Tympanic membrane and external ear normal.  ?   Nose: Nose normal.  ?Eyes:  ?   Extraocular Movements: Extraocular movements intact.  ?   Pupils: Pupils are equal, round, and reactive to light.  ?Cardiovascular:  ?   Rate and Rhythm: Normal rate and regular rhythm.  ?Pulmonary:  ?   Effort: Pulmonary effort is normal.  ?   Breath sounds: Normal breath sounds.  ?Abdominal:  ?   General: Bowel sounds are normal. There is distension.  ?   Palpations: Abdomen is soft.  ?Musculoskeletal:     ?   General: Normal range of motion.  ?   Cervical back: Normal range of motion and neck supple.  ?Skin: ?   General: Skin is warm and dry.  ?Neurological:  ?    Mental Status: She is alert and oriented to person, place, and time.  ?Psychiatric:     ?   Mood and Affect: Mood normal.     ?   Behavior: Behavior normal.     ?   Thought Content: Thought content normal.     ?   Judgment: Judgment normal.  ? ? ?  ? ?Assessment & Plan:  ?Ana Pratt was seen today for restablish care. ? ?Diagnoses and all orders for this visit: ?Encounter to establish care ?RE-Establish care  ? ?Screening for diabetes mellitus ?-     HgB A1c 5.5 ? ?Essential hypertension ?BP goal - < 130/80 ?Explained that having normal blood pressure is the goal and medications are helping to get to goal and maintain normal blood pressure. ?DIET: Limit salt intake, read nutrition labels to check salt content, limit fried and high fatty foods  ?Avoid using multisymptom OTC cold preparations that generally contain sudafed which can rise BP. Consult with pharmacist on best cold relief products to use for persons with HTN ?EXERCISE ?Discussed incorporating exercise such as walking - 30 minutes most days of the week and can do in 10 minute intervals    ?-     amLODipine (NORVASC) 10 MG tablet; Take 1 tablet (10 mg total) by mouth daily. ?-     hydrochlorothiazide (HYDRODIURIL) 25 MG tablet; Take 1 tablet (25 mg total) by mouth daily. ? ?Class 3 severe obesity due to excess calories with serious comorbidity in adult, unspecified BMI (HCC) ?Obesity is 30-39 indicating an excess in caloric intake or underlining conditions. This may lead to other co-morbidities. Lifestyle modifications of diet and exercise may reduce obesity.   ?  ?Lipid screening ? Healthy lifestyle diet of fruits vegetables fish nuts whole grains and low saturated fat . Foods high in cholesterol or liver, fatty meats,cheese, butter avocados, nuts and seeds, chocolate and fried foods. ? ?  ? ?Grayce Sessions, NP ? ? ?

## 2022-01-20 LAB — CBC WITH DIFFERENTIAL/PLATELET
Basophils Absolute: 0.1 10*3/uL (ref 0.0–0.2)
Basos: 1 %
EOS (ABSOLUTE): 0.2 10*3/uL (ref 0.0–0.4)
Eos: 5 %
Hematocrit: 42.8 % (ref 34.0–46.6)
Hemoglobin: 13.9 g/dL (ref 11.1–15.9)
Immature Grans (Abs): 0 10*3/uL (ref 0.0–0.1)
Immature Granulocytes: 0 %
Lymphocytes Absolute: 1.8 10*3/uL (ref 0.7–3.1)
Lymphs: 40 %
MCH: 26.6 pg (ref 26.6–33.0)
MCHC: 32.5 g/dL (ref 31.5–35.7)
MCV: 82 fL (ref 79–97)
Monocytes Absolute: 0.4 10*3/uL (ref 0.1–0.9)
Monocytes: 10 %
Neutrophils Absolute: 2 10*3/uL (ref 1.4–7.0)
Neutrophils: 44 %
Platelets: 366 10*3/uL (ref 150–450)
RBC: 5.22 x10E6/uL (ref 3.77–5.28)
RDW: 12.9 % (ref 11.7–15.4)
WBC: 4.5 10*3/uL (ref 3.4–10.8)

## 2022-01-20 LAB — CMP14+EGFR
ALT: 19 IU/L (ref 0–32)
AST: 16 IU/L (ref 0–40)
Albumin/Globulin Ratio: 1.5 (ref 1.2–2.2)
Albumin: 4.3 g/dL (ref 3.8–4.9)
Alkaline Phosphatase: 71 IU/L (ref 44–121)
BUN/Creatinine Ratio: 14 (ref 9–23)
BUN: 13 mg/dL (ref 6–24)
Bilirubin Total: 0.4 mg/dL (ref 0.0–1.2)
CO2: 22 mmol/L (ref 20–29)
Calcium: 9.5 mg/dL (ref 8.7–10.2)
Chloride: 105 mmol/L (ref 96–106)
Creatinine, Ser: 0.9 mg/dL (ref 0.57–1.00)
Globulin, Total: 2.9 g/dL (ref 1.5–4.5)
Glucose: 60 mg/dL — ABNORMAL LOW (ref 70–99)
Potassium: 4.2 mmol/L (ref 3.5–5.2)
Sodium: 140 mmol/L (ref 134–144)
Total Protein: 7.2 g/dL (ref 6.0–8.5)
eGFR: 74 mL/min/{1.73_m2} (ref >59)

## 2022-01-20 LAB — LIPID PANEL
Chol/HDL Ratio: 2.9 ratio (ref 0.0–4.4)
Cholesterol, Total: 209 mg/dL — ABNORMAL HIGH (ref 100–199)
HDL: 72 mg/dL (ref >39)
LDL Chol Calc (NIH): 126 mg/dL — ABNORMAL HIGH (ref 0–99)
Triglycerides: 59 mg/dL (ref 0–149)
VLDL Cholesterol Cal: 11 mg/dL (ref 5–40)

## 2022-03-09 ENCOUNTER — Ambulatory Visit (INDEPENDENT_AMBULATORY_CARE_PROVIDER_SITE_OTHER): Payer: 59 | Admitting: Primary Care

## 2022-03-09 ENCOUNTER — Encounter (INDEPENDENT_AMBULATORY_CARE_PROVIDER_SITE_OTHER): Payer: Self-pay | Admitting: Primary Care

## 2022-03-09 VITALS — BP 128/85 | HR 79 | Temp 98.2°F | Ht 61.5 in | Wt 211.6 lb

## 2022-03-09 DIAGNOSIS — Z1231 Encounter for screening mammogram for malignant neoplasm of breast: Secondary | ICD-10-CM | POA: Diagnosis not present

## 2022-03-09 DIAGNOSIS — I10 Essential (primary) hypertension: Secondary | ICD-10-CM | POA: Diagnosis not present

## 2022-03-09 DIAGNOSIS — E785 Hyperlipidemia, unspecified: Secondary | ICD-10-CM | POA: Diagnosis not present

## 2022-03-09 MED ORDER — AMLODIPINE BESYLATE 10 MG PO TABS: 10.0000 mg | ORAL_TABLET | Freq: Every day | ORAL | 1 refills | Status: DC

## 2022-03-09 MED ORDER — HYDROCHLOROTHIAZIDE 25 MG PO TABS: 25.0000 mg | ORAL_TABLET | Freq: Every day | ORAL | 1 refills | Status: DC

## 2022-03-09 MED ORDER — MELOXICAM 15 MG PO TABS: ORAL_TABLET | ORAL | 1 refills | Status: DC

## 2022-03-09 NOTE — Patient Instructions (Signed)
Your cholesterol higher than expected. High cholesterol may increase risk of heart attack and/or stroke. Consider eating more fruits, vegetables, and lean baked meats such as chicken or fish. Moderate intensity exercise at least 150 minutes as tolerated per week may help as well. Will monitor .

## 2022-03-09 NOTE — Progress Notes (Signed)
[place] is a 56 y.o. female presents for hypertension evaluation, Denies shortness of breath, headaches, chest pain or lower extremity edema, sudden onset, vision changes, unilateral weakness, dizziness, paresthesias   Patient reports adherence with medications.  Dietary habits include: change her eating habits monitoring sodium intake  Exercise habits include:yes  Family / Social history: None   Past Medical History:  Diagnosis Date   Allergy    Spring--uses Claritin D.   Greater trochanteric bursitis of left hip 05/2015   Knee pain, left 05/2015   Shingles age 20 or 56 yo   Shingles 08/08/2016   Left C8 dermatome   Past Surgical History:  Procedure Laterality Date   [place], 2003   Twin birth in 1989   No Known Allergies Current Outpatient Medications on File Prior to Visit  Medication Sig Dispense Refill   amLODipine (NORVASC) 10 MG tablet Take 1 tablet (10 mg total) by mouth daily. 90 tablet 1   hydrochlorothiazide (HYDRODIURIL) 25 MG tablet Take 1 tablet (25 mg total) by mouth daily. 90 tablet 1   meloxicam (MOBIC) 15 MG tablet 1 tab by mouth daily with food as needed for pain 30 tablet 6   No current facility-administered medications on file prior to visit.   Social History   Socioeconomic History   Marital status: Single    Spouse name: Not on file   Number of children: 3   Years of education: 12   Highest education level: Not on file  Occupational History   Occupation: Janitorial work/housecleaners    Comment: [employer] and homes on the side  Tobacco Use   Smoking status: Never   Smokeless tobacco: Never  Substance and Sexual Activity   Alcohol use: No    Alcohol/week: 0.0 standard drinks of alcohol   Drug use: No   Sexual activity: Yes  Other Topics Concern   Not on file  Social History Narrative   Originally from [place], [place]   Moved to the [place]. In 1995    Lives with one son and a daughter.  Her mother lives with her on and off.   Social Determinants of Health   Financial Resource Strain: Not on file  Food Insecurity: Not on file  Transportation Needs: Not on file  Physical Activity: Not on file  Stress: Not on file  Social Connections: Not on file  Intimate Partner Violence: Not on file   Family History  Problem Relation Age of Onset   Hypertension Mother    Arthritis Mother    Kidney disease Mother    Breast cancer Neg Hx      OBJECTIVE:  Vitals:   03/09/22 0950  BP: 128/85  Pulse: 79  Temp: 98.2 F (36.8 C)  TempSrc: Oral  SpO2: 96%  Weight: 211 lb 9.6 oz (96 kg)  Height: 5' 1.5" (1.562 m)    Physical Exam Vitals reviewed.  Constitutional:      Appearance: She is obese.  HENT:     Head: Normocephalic.     Right Ear: External ear normal.     Left Ear: External ear normal.     Nose: Nose normal.  Eyes:     Extraocular Movements: Extraocular movements intact.     Pupils: Pupils are equal, round, and reactive to light.  Cardiovascular:     Rate and Rhythm: Normal rate and regular rhythm.  Pulmonary:  Effort: Pulmonary effort is normal.     Breath sounds: Normal breath sounds.  Abdominal:     General: Bowel sounds are normal. There is distension.     Palpations: Abdomen is soft.  Musculoskeletal:        General: Normal range of motion.     Cervical back: Normal range of motion.  Skin:    General: Skin is warm and dry.  Neurological:     Mental Status: She is alert and oriented to person, place, and time.  Psychiatric:        Mood and Affect: Mood normal.        Behavior: Behavior normal.        Thought Content: Thought content normal.        Judgment: Judgment normal.    ROS Comprehensive ROS Pertinent positive and negative noted in HPI   Last 3 Office BP readings: BP Readings from Last 3 Encounters:  03/09/22 128/85  01/19/22 (!) 152/94  12/31/20 (!) 148/89    BMET    Component Value  Date/Time   NA 140 01/19/2022 1033   K 4.2 01/19/2022 1033   CL 105 01/19/2022 1033   CO2 22 01/19/2022 1033   GLUCOSE 60 (L) 01/19/2022 1033   BUN 13 01/19/2022 1033   CREATININE 0.90 01/19/2022 1033   CALCIUM 9.5 01/19/2022 1033   GFRNONAA 81 01/21/2019 1116   GFRAA 93 01/21/2019 1116    Renal function: CrCl cannot be calculated (Patient's most recent lab result is older than the maximum 21 days allowed.).  Clinical ASCVD: Yes  The 10-year ASCVD risk score (Arnett DK, et al., 2019) is: 5.1%   Values used to calculate the score:     Age: 35 years     Sex: Female     Is Non-Hispanic African American: Yes     Diabetic: No     Tobacco smoker: No     Systolic Blood Pressure: 0000000 mmHg     Is BP treated: Yes     HDL Cholesterol: 72 mg/dL     Total Cholesterol: 209 mg/dL  ASCVD risk factors include- [place]   ASSESSMENT & PLAN: Zurri was seen today for blood pressure check.  Diagnoses and all orders for this visit:  Encounter for screening mammogram for malignant neoplasm of breast Referral for mammogram   Class 3 severe obesity due to excess calories with serious comorbidity in adult, unspecified BMI ([place]) Obesity is 30-39 indicating an excess in caloric intake or underlining conditions. This may lead to other co-morbidities. Lifestyle modifications of diet and exercise may reduce obesity.    Essential hypertension -Counseled on lifestyle modifications for blood pressure control including reduced dietary sodium, increased exercise, weight reduction and adequate sleep. Also, educated patient about the risk for cardiovascular events, stroke and heart attack. Also counseled patient about the importance of medication adherence. If you participate in smoking, it is important to stop using tobacco as this will increase the risks associated with uncontrolled blood pressure.  Goal BP:  For patients younger than 60: Goal BP < 130/80. For patients 60 and older: Goal BP < 140/90. For  patients with diabetes: Goal BP < 130/80. Your most recent BP: 128/85  Minimize salt intake. Minimize alcohol intake    This note has been created with Surveyor, quantity. Any transcriptional errors are unintentional.   Kerin Perna, NP 03/09/2022, 10:21 AM

## 2022-04-05 ENCOUNTER — Encounter (INDEPENDENT_AMBULATORY_CARE_PROVIDER_SITE_OTHER): Payer: Self-pay | Admitting: Primary Care

## 2022-08-23 ENCOUNTER — Ambulatory Visit (INDEPENDENT_AMBULATORY_CARE_PROVIDER_SITE_OTHER): Payer: Self-pay | Admitting: Primary Care

## 2022-09-08 ENCOUNTER — Ambulatory Visit (INDEPENDENT_AMBULATORY_CARE_PROVIDER_SITE_OTHER): Payer: Self-pay | Admitting: Primary Care

## 2022-09-08 ENCOUNTER — Encounter (INDEPENDENT_AMBULATORY_CARE_PROVIDER_SITE_OTHER): Payer: Self-pay | Admitting: Primary Care

## 2022-09-08 VITALS — BP 137/88 | HR 85 | Resp 16 | Ht 61.0 in | Wt 217.2 lb

## 2022-09-08 DIAGNOSIS — I10 Essential (primary) hypertension: Secondary | ICD-10-CM

## 2022-09-08 DIAGNOSIS — E785 Hyperlipidemia, unspecified: Secondary | ICD-10-CM

## 2022-09-08 DIAGNOSIS — Z2821 Immunization not carried out because of patient refusal: Secondary | ICD-10-CM

## 2022-09-08 MED ORDER — AMLODIPINE BESYLATE 10 MG PO TABS: 10.0000 mg | ORAL_TABLET | Freq: Every day | ORAL | 1 refills | Status: DC

## 2022-09-08 MED ORDER — HYDROCHLOROTHIAZIDE 25 MG PO TABS: 25.0000 mg | ORAL_TABLET | Freq: Every day | ORAL | 1 refills | Status: DC

## 2022-09-08 MED ORDER — MELOXICAM 15 MG PO TABS: ORAL_TABLET | ORAL | 1 refills | Status: DC

## 2022-09-08 NOTE — Progress Notes (Signed)
[facility]  Ana Pratt, is a 56 y.o. female  NTI:[id number]  QQP:[id number]  DOB - Aug 28, 1966  Chief Complaint  Patient presents with   Hypertension       Subjective:   [name] is a is a 56 y.o. morbid obese female here today for a follow up for hyperlipidemia .  Unfortunately she did not read her MyChart message and changes were not made and suggested.  Will check lipid panel today to determine if lifestyle modification is still adequate if not we will look at adding a statin to help control cholesterol.  Also, hypertension patient has No headache, No chest pain, No abdominal pain - No Nausea, No new weakness tingling or numbness, No Cough - shortness of breath.  No problems updated.  No Known Allergies  Past Medical History:  Diagnosis Date   Allergy    Spring--uses Claritin D.   Greater trochanteric bursitis of left hip 05/2015   Knee pain, left 05/2015   Shingles age 49 or 56 yo   Shingles 08/08/2016   Left C8 dermatome    Current Outpatient Medications on File Prior to Visit  Medication Sig Dispense Refill   amLODipine (NORVASC) 10 MG tablet Take 1 tablet (10 mg total) by mouth daily. 90 tablet 1   hydrochlorothiazide (HYDRODIURIL) 25 MG tablet Take 1 tablet (25 mg total) by mouth daily. 90 tablet 1   meloxicam (MOBIC) 15 MG tablet 1 tab by mouth daily with food as needed for pain 90 tablet 1   No current facility-administered medications on file prior to visit.    Objective:   Vitals:   09/08/22 1001  BP: 137/88  Pulse: 85  Resp: 16  SpO2: 95%  Weight: 217 lb 3.2 oz (98.5 kg)  Height: 5\' 1"  (1.549 m)    Exam General appearance : Awake, alert, not in any distress. Speech Clear. Not toxic looking HEENT: Atraumatic and Normocephalic, pupils equally reactive to light and accomodation Neck: Supple, no JVD. No cervical lymphadenopathy.  Chest: Good air entry bilaterally, no added sounds  CVS: S1 S2 regular, no murmurs.  Abdomen:  Bowel sounds present, Non tender and not distended with no gaurding, rigidity or rebound. Extremities: B/L Lower Ext shows no edema, both legs are warm to touch Neurology: Awake alert, and oriented X 3, CN II-XII intact, Non focal Skin: No Rash  Data Review Lab Results  Component Value Date   HGBA1C 5.5 01/19/2022   HGBA1C 5.2 05/07/2018    Assessment & Plan  Bridgitte was seen today for hypertension.  Diagnoses and all orders for this visit:  Influenza vaccination declined  Varicella zoster virus (VZV) vaccination declined  Morbidly obese (HCC) Morbid Obesity is > 40 indicating an excess in caloric intake or underlining conditions. This may lead to other co-morbidities. Educated on lifestyle modifications of diet and exercise which may reduce obesity.   -     Lipid Panel  Hyperlipidemia LDL goal <70 -     Lipid Panel  Essential hypertension BP goal - < 130/80 Explained that having normal blood pressure is the goal and medications are helping to get to goal and maintain normal blood pressure. DIET: Limit salt intake, read nutrition labels to check salt content, limit fried and high fatty foods  Avoid using multisymptom OTC cold preparations that generally contain sudafed which can rise BP. Consult with pharmacist on best cold relief products to use for persons with HTN EXERCISE Discussed incorporating exercise such as walking - 30 minutes  most days of the week and can do in 10 minute intervals    -     amLODipine (NORVASC) 10 MG tablet; Take 1 tablet (10 mg total) by mouth daily. -     hydrochlorothiazide (HYDRODIURIL) 25 MG tablet; Take 1 tablet (25 mg total) by mouth daily.  Other orders -     meloxicam (MOBIC) 15 MG tablet; 1 tab by mouth daily with food as needed for pain    Patient have been counseled extensively about nutrition and exercise. Other issues discussed during this visit include: low cholesterol diet, weight control and daily exercise, foot care, annual eye  examinations at Ophthalmology, importance of adherence with medications and regular follow-up. We also discussed long term complications of uncontrolled diabetes and hypertension.   Return in about 6 weeks (around 10/20/2022) for pap.  The patient was given clear instructions to go to ER or return to medical center if symptoms don't improve, worsen or new problems develop. The patient verbalized understanding. The patient was told to call to get lab results if they haven't heard anything in the next week.   This note has been created with Education officer, environmental. Any transcriptional errors are unintentional.   Grayce Sessions, NP 09/08/2022, 10:44 AM

## 2022-09-08 NOTE — Patient Instructions (Signed)
Calorie Counting for Weight Loss Calories are units of energy. Your body needs a certain number of calories from food to keep going throughout the day. When you eat or drink more calories than your body needs, your body stores the extra calories mostly as fat. When you eat or drink fewer calories than your body needs, your body burns fat to get the energy it needs. Calorie counting means keeping track of how many calories you eat and drink each day. Calorie counting can be helpful if you need to lose weight. If you eat fewer calories than your body needs, you should lose weight. Ask your health care provider what a healthy weight is for you. For calorie counting to work, you will need to eat the right number of calories each day to lose a healthy amount of weight per week. A dietitian can help you figure out how many calories you need in a day and will suggest ways to reach your calorie goal. A healthy amount of weight to lose each week is usually 1-2 lb (0.5-0.9 kg). This usually means that your daily calorie intake should be reduced by 500-750 calories. Eating 1,200-1,500 calories a day can help most women lose weight. Eating 1,500-1,800 calories a day can help most men lose weight. What do I need to know about calorie counting? Work with your health care provider or dietitian to determine how many calories you should get each day. To meet your daily calorie goal, you will need to: Find out how many calories are in each food that you would like to eat. Try to do this before you eat. Decide how much of the food you plan to eat. Keep a food log. Do this by writing down what you ate and how many calories it had. To successfully lose weight, it is important to balance calorie counting with a healthy lifestyle that includes regular activity. Where do I find calorie information?  The number of calories in a food can be found on a Nutrition Facts label. If a food does not have a Nutrition Facts label, try  to look up the calories online or ask your dietitian for help. Remember that calories are listed per serving. If you choose to have more than one serving of a food, you will have to multiply the calories per serving by the number of servings you plan to eat. For example, the label on a package of bread might say that a serving size is 1 slice and that there are 90 calories in a serving. If you eat 1 slice, you will have eaten 90 calories. If you eat 2 slices, you will have eaten 180 calories. How do I keep a food log? After each time that you eat, record the following in your food log as soon as possible: What you ate. Be sure to include toppings, sauces, and other extras on the food. How much you ate. This can be measured in cups, ounces, or number of items. How many calories were in each food and drink. The total number of calories in the food you ate. Keep your food log near you, such as in a pocket-sized notebook or on an app or website on your mobile phone. Some programs will calculate calories for you and show you how many calories you have left to meet your daily goal. What are some portion-control tips? Know how many calories are in a serving. This will help you know how many servings you can have of a certain   food. Use a measuring cup to measure serving sizes. You could also try weighing out portions on a kitchen scale. With time, you will be able to estimate serving sizes for some foods. Take time to put servings of different foods on your favorite plates or in your favorite bowls and cups so you know what a serving looks like. Try not to eat straight from a food's packaging, such as from a bag or box. Eating straight from the package makes it hard to see how much you are eating and can lead to overeating. Put the amount you would like to eat in a cup or on a plate to make sure you are eating the right portion. Use smaller plates, glasses, and bowls for smaller portions and to prevent  overeating. Try not to multitask. For example, avoid watching TV or using your computer while eating. If it is time to eat, sit down at a table and enjoy your food. This will help you recognize when you are full. It will also help you be more mindful of what and how much you are eating. What are tips for following this plan? Reading food labels Check the calorie count compared with the serving size. The serving size may be smaller than what you are used to eating. Check the source of the calories. Try to choose foods that are high in protein, fiber, and vitamins, and low in saturated fat, trans fat, and sodium. Shopping Read nutrition labels while you shop. This will help you make healthy decisions about which foods to buy. Pay attention to nutrition labels for low-fat or fat-free foods. These foods sometimes have the same number of calories or more calories than the full-fat versions. They also often have added sugar, starch, or salt to make up for flavor that was removed with the fat. Make a grocery list of lower-calorie foods and stick to it. Cooking Try to cook your favorite foods in a healthier way. For example, try baking instead of frying. Use low-fat dairy products. Meal planning Use more fruits and vegetables. One-half of your plate should be fruits and vegetables. Include lean proteins, such as chicken, turkey, and fish. Lifestyle Each week, aim to do one of the following: 150 minutes of moderate exercise, such as walking. 75 minutes of vigorous exercise, such as running. General information Know how many calories are in the foods you eat most often. This will help you calculate calorie counts faster. Find a way of tracking calories that works for you. Get creative. Try different apps or programs if writing down calories does not work for you. What foods should I eat?  Eat nutritious foods. It is better to have a nutritious, high-calorie food, such as an avocado, than a food with  few nutrients, such as a bag of potato chips. Use your calories on foods and drinks that will fill you up and will not leave you hungry soon after eating. Examples of foods that fill you up are nuts and nut butters, vegetables, lean proteins, and high-fiber foods such as whole grains. High-fiber foods are foods with more than 5 g of fiber per serving. Pay attention to calories in drinks. Low-calorie drinks include water and unsweetened drinks. The items listed above may not be a complete list of foods and beverages you can eat. Contact a dietitian for more information. What foods should I limit? Limit foods or drinks that are not good sources of vitamins, minerals, or protein or that are high in unhealthy fats. These   include: Candy. Other sweets. Sodas, specialty coffee drinks, alcohol, and juice. The items listed above may not be a complete list of foods and beverages you should avoid. Contact a dietitian for more information. How do I count calories when eating out? Pay attention to portions. Often, portions are much larger when eating out. Try these tips to keep portions smaller: Consider sharing a meal instead of getting your own. If you get your own meal, eat only half of it. Before you start eating, ask for a container and put half of your meal into it. When available, consider ordering smaller portions from the menu instead of full portions. Pay attention to your food and drink choices. Knowing the way food is cooked and what is included with the meal can help you eat fewer calories. If calories are listed on the menu, choose the lower-calorie options. Choose dishes that include vegetables, fruits, whole grains, low-fat dairy products, and lean proteins. Choose items that are boiled, broiled, grilled, or steamed. Avoid items that are buttered, battered, fried, or served with cream sauce. Items labeled as crispy are usually fried, unless stated otherwise. Choose water, low-fat milk,  unsweetened iced tea, or other drinks without added sugar. If you want an alcoholic beverage, choose a lower-calorie option, such as a glass of wine or light beer. Ask for dressings, sauces, and syrups on the side. These are usually high in calories, so you should limit the amount you eat. If you want a salad, choose a garden salad and ask for grilled meats. Avoid extra toppings such as bacon, cheese, or fried items. Ask for the dressing on the side, or ask for olive oil and vinegar or lemon to use as dressing. Estimate how many servings of a food you are given. Knowing serving sizes will help you be aware of how much food you are eating at restaurants. Where to find more information Centers for Disease Control and Prevention: www.cdc.gov U.[place]. Department of Agriculture: myplate.gov Summary Calorie counting means keeping track of how many calories you eat and drink each day. If you eat fewer calories than your body needs, you should lose weight. A healthy amount of weight to lose per week is usually 1-2 lb (0.5-0.9 kg). This usually means reducing your daily calorie intake by 500-750 calories. The number of calories in a food can be found on a Nutrition Facts label. If a food does not have a Nutrition Facts label, try to look up the calories online or ask your dietitian for help. Use smaller plates, glasses, and bowls for smaller portions and to prevent overeating. Use your calories on foods and drinks that will fill you up and not leave you hungry shortly after a meal. This information is not intended to replace advice given to you by your health care provider. Make sure you discuss any questions you have with your health care provider. Document Revised: 10/24/2019 Document Reviewed: 10/24/2019 Elsevier Patient Education  2023 Elsevier Inc.  

## 2022-09-09 LAB — LIPID PANEL
Chol/HDL Ratio: 3.1 ratio (ref 0.0–4.4)
Cholesterol, Total: 221 mg/dL — ABNORMAL HIGH (ref 100–199)
HDL: 72 mg/dL (ref >39)
LDL Chol Calc (NIH): 135 mg/dL — ABNORMAL HIGH (ref 0–99)
Triglycerides: 78 mg/dL (ref 0–149)
VLDL Cholesterol Cal: 14 mg/dL (ref 5–40)

## 2022-09-12 ENCOUNTER — Other Ambulatory Visit (INDEPENDENT_AMBULATORY_CARE_PROVIDER_SITE_OTHER): Payer: Self-pay | Admitting: Primary Care

## 2022-09-12 MED ORDER — PRAVASTATIN SODIUM 20 MG PO TABS: 20.0000 mg | ORAL_TABLET | Freq: Every day | ORAL | 1 refills | Status: DC

## 2022-10-20 ENCOUNTER — Encounter (INDEPENDENT_AMBULATORY_CARE_PROVIDER_SITE_OTHER): Payer: Self-pay | Admitting: Primary Care

## 2022-10-20 ENCOUNTER — Other Ambulatory Visit (HOSPITAL_COMMUNITY)
Admission: RE | Admit: 2022-10-20 | Discharge: 2022-10-20 | Disposition: A | Discharge status: Home / Self Care | Payer: 59 | Source: Ambulatory Visit | Attending: Primary Care | Admitting: Primary Care

## 2022-10-20 ENCOUNTER — Ambulatory Visit (INDEPENDENT_AMBULATORY_CARE_PROVIDER_SITE_OTHER): Payer: 59 | Admitting: Primary Care

## 2022-10-20 VITALS — BP 127/87 | HR 97 | Resp 16 | Wt 215.0 lb

## 2022-10-20 DIAGNOSIS — Z124 Encounter for screening for malignant neoplasm of cervix: Secondary | ICD-10-CM

## 2022-10-21 LAB — CERVICOVAGINAL ANCILLARY ONLY
Bacterial Vaginitis (gardnerella): NEGATIVE
Candida Glabrata: NEGATIVE
Candida Vaginitis: NEGATIVE
Chlamydia: NEGATIVE
Comment: NEGATIVE
Comment: NEGATIVE
Comment: NEGATIVE
Comment: NEGATIVE
Comment: NEGATIVE
Comment: NORMAL
Neisseria Gonorrhea: NEGATIVE
Trichomonas: NEGATIVE

## 2022-10-25 NOTE — Progress Notes (Signed)
  [name] Patient name: Ana Pratt MRN [id number]  Date of birth: 11-20-1965 Chief Complaint:   Gynecologic Exam  History of Present Illness:   Ana Pratt is a 57 y.o. G32P3000 female being seen today for a routine well-woman exam.   CC:gyn  The current method of family planning is none.  Patient's last menstrual period was 07/27/2014 (approximate). Last pap 06/14/18. Last mammogram: 04/02/22. Results were: normal. Family h/o breast cancer: No Last colonoscopy: 07/26/14. Results were: normal. Family h/o colorectal cancer: No  Review of Systems:    Denies any headaches, blurred vision, fatigue, shortness of breath, chest pain, abdominal pain, abnormal vaginal discharge/itching/odor/irritation, problems with periods, bowel movements, urination, or intercourse unless otherwise stated above.  Pertinent History Reviewed:   Reviewed past medical,surgical, social and family history.  Reviewed problem list, medications and allergies.  Physical Assessment:   Vitals:   10/20/22 1121  BP: 127/87  Pulse: 97  Resp: 16  SpO2: 99%  Weight: 215 lb (97.5 kg)  Body mass index is 40.62 kg/m.        Physical Examination:  General appearance - well appearing, and in no distress Mental status - alert, oriented to person, place, and time Psych:  She has a normal mood and affect Skin - warm and dry, normal color, no suspicious lesions noted Chest - effort normal, all lung fields clear to auscultation bilaterally Heart - normal rate and regular rhythm Neck:  midline trachea, no thyromegaly or nodules Breasts - breasts appear normal, no suspicious masses, no skin or nipple changes or axillary nodes Educated patient on proper self breast examination and had patient to demonstrate SBE. Abdomen - soft, nontender, nondistended, no masses or organomegaly Pelvic-VULVA: normal appearing vulva with no masses, tenderness or lesions   VAGINA: normal  appearing vagina with normal color and discharge, no lesions   CERVIX: normal appearing cervix without discharge or lesions, no CMT UTERUS: uterus is felt to be normal size, shape, consistency and nontender  ADNEXA: No adnexal masses or tenderness noted. Extremities:  No swelling or varicosities noted   [name] was seen today for gynecologic exam.  Diagnoses and all orders for this visit:  Cervical cancer screening -     Cervicovaginal ancillary only -     Cytology - PAP    This note has been created with Dragon speech recognition software and Engineer, materials. Any transcriptional errors are unintentional.   Kerin Perna, NP 10/25/2022, 10:30 PM

## 2022-10-27 LAB — CYTOLOGY - PAP
Comment: NEGATIVE
Diagnosis: NEGATIVE
High risk HPV: NEGATIVE

## 2023-10-23 ENCOUNTER — Ambulatory Visit (INDEPENDENT_AMBULATORY_CARE_PROVIDER_SITE_OTHER): Payer: BLUE CROSS/BLUE SHIELD | Admitting: Primary Care

## 2023-11-20 ENCOUNTER — Telehealth (INDEPENDENT_AMBULATORY_CARE_PROVIDER_SITE_OTHER): Payer: Self-pay | Admitting: Primary Care

## 2023-11-20 NOTE — Telephone Encounter (Signed)
 Called pt to remind them about atp. Pt will be present

## 2023-11-27 ENCOUNTER — Encounter (INDEPENDENT_AMBULATORY_CARE_PROVIDER_SITE_OTHER): Payer: Self-pay | Admitting: Primary Care

## 2023-11-27 ENCOUNTER — Ambulatory Visit (INDEPENDENT_AMBULATORY_CARE_PROVIDER_SITE_OTHER): Payer: Self-pay | Admitting: Primary Care

## 2023-11-27 VITALS — BP 152/92 | HR 69 | Temp 98.5°F | Resp 14 | Ht 61.0 in | Wt 215.0 lb

## 2023-11-27 DIAGNOSIS — E785 Hyperlipidemia, unspecified: Secondary | ICD-10-CM

## 2023-11-27 DIAGNOSIS — Z0000 Encounter for general adult medical examination without abnormal findings: Secondary | ICD-10-CM

## 2023-11-27 DIAGNOSIS — I10 Essential (primary) hypertension: Secondary | ICD-10-CM

## 2023-11-27 NOTE — Patient Instructions (Signed)
Cooking With Less Salt Cooking with less salt is one way to reduce the amount of salt (sodium) you get from food. Most people should have less than 2,300 milligrams (mg) of sodium each day. If you have high blood pressure (hypertension), you may need to limit your sodium to 1,500 mg each day. Follow the tips below to help reduce your sodium intake. What are tips for eating less sodium? Reading food labels  Check the food label before buying or using packaged ingredients. Always check the label for the serving size and sodium content. Choose products with less than 140 mg of sodium per serving. Check the % Daily Value column to see what percent of the daily recommended amount of sodium is in one serving of the product. Foods with 5% or less are low in sodium. Foods with 20% or more are high in sodium. Do not choose foods that have salt as one of the first three ingredients on the ingredients list. Always check how much sodium is in a product, even if the label says "unsalted" or "no salt added." Shopping Buy sodium-free or low-sodium products. Look for these words: Low-sodium. Sodium-free. Reduced-sodium. No salt added. Unsalted. Buy fresh or frozen foods without sauces or additives. Cooking Instead of salt, use herbs, seasonings without salt, and spices. Use sodium-free baking soda. Grill, braise, or roast foods to add flavor with less salt. Do not add salt to pasta, rice, or hot cereals. Drain and rinse canned vegetables, beans, and meat before use. Do not add salt when cooking sweets and desserts. Cook with low-sodium ingredients. Meal planning The sodium in bread can add up. Try to plan meals with other grains. These may include whole oats, quinoa, whole wheat pasta, and other whole grains that do not have sodium added to them. What foods are high in sodium? Vegetables Regular canned vegetables, except low-sodium or reduced-sodium items. Sauerkraut, pickled vegetables, and relishes.  Olives. French fries. Onion rings. Regular canned tomato sauce and paste. Regular tomato and vegetable juice. Frozen vegetables in sauces. Grains Instant hot cereals. Bread stuffing, pancake, and biscuit mixes. Croutons. Seasoned rice or pasta mixes. Noodle soup cups. Boxed or frozen macaroni and cheese. Regular salted crackers. Self-rising flour. Rolls. Bagels. Flour tortillas and wraps. Meats and other proteins Meat or fish that is salted, canned, smoked, cured, spiced, or pickled. Precooked or cured meat, such as sausages or meat loaves. Bacon. Ham. Pepperoni. Hot dogs. Corned beef. Chipped beef. Salt pork. Jerky. Pickled herring, anchovies, and sardines. Regular canned tuna. Salted nuts. Dairy Processed cheese and cheese spreads. Hard cheeses. Cheese curds. Blue cheese. Feta cheese. String cheese. Regular cottage cheese. Buttermilk. Canned milk. The items listed above may not be a full list of foods high in sodium. Talk to a dietitian to learn more. What foods are low in sodium? Fruits Fresh, frozen, or canned fruit with no sauce added. Fruit juice. Vegetables Fresh or frozen vegetables with no sauce added. "No salt added" canned vegetables. "No salt added" tomato sauce and paste. Low-sodium or reduced-sodium tomato and vegetable juice. Grains Noodles, pasta, quinoa, rice. Shredded or puffed wheat or puffed rice. Regular or quick oats (not instant). Low-sodium crackers. Low-sodium bread. Whole grain bread and whole grain pasta. Unsalted popcorn. Meats and other proteins Fresh or frozen whole meats, poultry that has not been injected with sodium, and fish with no sauce added. Unsalted nuts. Dried peas, beans, and lentils without added salt. Unsalted canned beans. Eggs. Unsalted nut butters. Low-sodium canned tuna or chicken. Dairy   Milk. Soy milk. Yogurt. Low-sodium cheeses, such as Swiss, Monterey Jack, mozzarella, and ricotta. Sherbet or ice cream (keep to  cup per serving). Cream  cheese. Fats and oils Unsalted butter or margarine. Other foods Homemade pudding. Sodium-free baking soda and baking powder. Herbs and spices. Low-sodium seasoning mixes. Beverages Coffee and tea. Carbonated beverages. The items listed above may not be a full list of foods low in sodium. Talk to a dietitian to learn more. What are some salt alternatives when cooking? Herbs, seasonings, and spices can be used instead of salt to flavor your food. Herbs should be fresh or dried. Do not choose packaged mixes. Next to the name of the herb, spice, or seasoning below are some foods you can pair it with. Herbs Bay leaves - Soups, meat and vegetable dishes, and spaghetti sauce. Basil - Italian dishes, soups, pasta, and fish dishes. Cilantro - Meat, poultry, and vegetable dishes. Chili powder - Marinades and Mexican dishes. Chives - Salad dressings and potato dishes. Cumin - Mexican dishes, couscous, and meat dishes. Dill - Fish dishes, sauces, and salads. Fennel - Meat and vegetable dishes, breads, and cookies. Garlic (do not use garlic salt) - Italian dishes, meat dishes, salad dressings, and sauces. Marjoram - Soups, potato dishes, and meat dishes. Oregano - Pizza and spaghetti sauce. Parsley - Salads, soups, pasta, and meat dishes. Rosemary - Italian dishes, salad dressings, soups, and red meats. Saffron - Fish dishes, pasta, and some poultry dishes. Sage - Stuffings and sauces. Tarragon - Fish and poultry dishes. Thyme - Stuffing, meat, and fish dishes. Seasonings Lemon juice - Fish dishes, poultry dishes, vegetables, and salads. Vinegar - Salad dressings, vegetables, and fish dishes. Spices Cinnamon - Sweet dishes, such as cakes, cookies, and puddings. Cloves - Gingerbread, puddings, and marinades for meats. Curry - Vegetable dishes, fish and poultry dishes, and stir-fry dishes. Ginger - Vegetable dishes, fish dishes, and stir-fry dishes. Nutmeg - Pasta, vegetables, poultry, fish  dishes, and custard. This information is not intended to replace advice given to you by your health care provider. Make sure you discuss any questions you have with your health care provider. Document Revised: 10/06/2022 Document Reviewed: 09/29/2022 Elsevier Patient Education  2024 Elsevier Inc.  

## 2023-11-27 NOTE — Progress Notes (Addendum)
 [facility]  Ana Pratt is a 58 y.o. female presents to office today for annual physical exam examination.  Blood pressure is elevated 153/96 will recheck she does not like taking medication so she has not been . Discussed risk factors African-American female obese with hypertension increased risk of stroke heart attack.  Also discussed diet and reading labels.  Concerns today include: 1. Weight   Occupation: Print production planner , Marital status: S, Substance use: N Diet: n, Exercise: n  Health Maintenance  Topic Date Due   Zoster Vaccines- Shingrix (1 of 2) Never done   INFLUENZA VACCINE  04/27/2023   COVID-19 Vaccine (1 - 2024-25 season) Never done   MAMMOGRAM  04/02/2024   DTaP/Tdap/Td (2 - Td or Tdap) 05/05/2024   Colonoscopy  07/26/2024   Cervical Cancer Screening (HPV/Pap Cotest)  10/21/2027   Hepatitis C Screening  Completed   HIV Screening  Completed   HPV VACCINES  Aged Out     Past Medical History:  Diagnosis Date   Allergy    Spring--uses Claritin D.   Greater trochanteric bursitis of left hip 05/2015   Knee pain, left 05/2015   Shingles age 65 or 58 yo   Shingles 08/08/2016   Left C8 dermatome   Social History   Socioeconomic History   Marital status: Single    Spouse name: Not on file   Number of children: 3   Years of education: 12   Highest education level: Not on file  Occupational History   Occupation: Merchant navy officer    Comment: [employer] and homes on the side  Tobacco Use   Smoking status: Never   Smokeless tobacco: Never  Substance and Sexual Activity   Alcohol use: No    Alcohol/week: 0.0 standard drinks of alcohol   Drug use: No   Sexual activity: Yes  Other Topics Concern   Not on file  Social History Narrative   Originally from [place], [place]   Moved [place] the [place]   Lives with one son and a daughter.  Her mother lives with her on and off.   Social Drivers of Research Scientist (physical Sciences) Strain: Not on file  Food Insecurity: Not on file  Transportation Needs: Not on file  Physical Activity: Not on file  Stress: Not on file  Social Connections: Not on file  Intimate Partner Violence: Not on file   Past Surgical History:  Procedure Laterality Date   CESAREAN SECTION     CESAREAN SECTION  1989, 2003   Twin birth in 24   Family History  Problem Relation Age of Onset   Hypertension Mother    Arthritis Mother    Kidney disease Mother    Breast cancer Neg Hx     Current Outpatient Medications:    amLODipine  (NORVASC ) 10 MG tablet, Take 1 tablet (10 mg total) by mouth daily., Disp: 90 tablet, Rfl: 1   hydrochlorothiazide  (HYDRODIURIL ) 25 MG tablet, Take 1 tablet (25 mg total) by mouth daily., Disp: 90 tablet, Rfl: 1   meloxicam  (MOBIC ) 15 MG tablet, 1 tab by mouth daily with food as needed for pain, Disp: 90 tablet, Rfl: 1   pravastatin  (PRAVACHOL ) 20 MG tablet, Take 1 tablet (20 mg total) by mouth daily., Disp: 90 tablet, Rfl: 1 Outpatient Encounter Medications as of 11/27/2023  Medication Sig   amLODipine  (NORVASC ) 10 MG tablet Take 1 tablet (10 mg total) by mouth daily.   hydrochlorothiazide  (HYDRODIURIL ) 25  MG tablet Take 1 tablet (25 mg total) by mouth daily.   meloxicam  (MOBIC ) 15 MG tablet 1 tab by mouth daily with food as needed for pain   pravastatin  (PRAVACHOL ) 20 MG tablet Take 1 tablet (20 mg total) by mouth daily.   No facility-administered encounter medications on file as of 11/27/2023.    No Known Allergies   ROS: Review of Systems Pertinent items noted in HPI and remainder of comprehensive ROS otherwise negative.    Physical exam BP (!) 153/96 (BP Location: Left Arm, Patient Position: Sitting, Cuff Size: Large)   Pulse 69   Temp 98.5 F (36.9 C) (Oral)   Resp 14   Ht 5' 1 (1.549 m)   Wt 215 lb (97.5 kg)   LMP 07/27/2014 (Approximate) Comment: None for a year  SpO2 98%   BMI 40.62 kg/m  General appearance: alert, appears stated  age, and morbidly obese Head: Normocephalic, without obvious abnormality, atraumatic Eyes: conjunctivae/corneas clear. PERRL, EOM's intact. Fundi benign. Nose: Nares normal. Septum midline. Mucosa normal. No drainage or sinus tenderness. Neck: no adenopathy, no carotid bruit, no JVD, supple, symmetrical, trachea midline, and thyroid not enlarged, symmetric, no tenderness/mass/nodules Lungs: clear to auscultation bilaterally Heart: regular rate and rhythm, S1, S2 normal, no murmur, click, rub or gallop Abdomen: soft, non-tender; bowel sounds normal; no masses,  no organomegaly Extremities: extremities normal, atraumatic, no cyanosis or edema Skin: Skin color, texture, turgor normal. No rashes or lesions Lymph nodes: Cervical, supraclavicular, and axillary nodes normal. Neurologic: Alert and oriented X 3, normal strength and tone. Normal symmetric reflexes. Normal coordination and gait    Assessment/ Plan: Ana Pratt was seen today for annual exam.  Diagnoses and all orders for this visit:  Annual physical exam -     CBC with Differential  Essential hypertension BP goal - < 130/80 Explained that having normal blood pressure is the goal and medications are helping to get to goal and maintain normal blood pressure. DIET: Limit salt intake, read nutrition labels to check salt content, limit fried and high fatty foods  Avoid using multisymptom OTC cold preparations that generally contain sudafed which can rise BP. Consult with pharmacist on best cold relief products to use for persons with HTN EXERCISE Discussed incorporating exercise such as walking - 30 minutes most days of the week and can do in 10 minute intervals    -     CMP14+EGFR  Morbidly obese (HCC) Morbid Obesity is > 40 indicating an excess in caloric intake or underlining conditions. This may lead to other co-morbidities. Educated on lifestyle modifications of diet and exercise which may reduce obesity.   -     Lipid  Panel  Hyperlipidemia LDL goal <70  Healthy lifestyle diet of fruits vegetables fish nuts whole grains and low saturated fat . Foods high in cholesterol or liver, fatty meats,cheese, butter avocados, nuts and seeds, chocolate and fried foods.    -     Lipid Panel    Counseled on healthy lifestyle choices, including diet (rich in fruits, vegetables and lean meats and low in salt and simple carbohydrates) and exercise (at least 30 minutes of moderate physical activity daily).  Patient to follow up in 1 year for annual exam or sooner if needed.  The above assessment and management plan was discussed with the patient. The patient verbalized understanding of and has agreed to the management plan. Patient is aware to call the clinic if symptoms persist or worsen. Patient is aware when to return  to the clinic for a follow-up visit. Patient educated on when it is appropriate to go to the emergency department.   This note has been created with Education officer, environmental. Any transcriptional errors are unintentional.   Rosaline SHAUNNA Bohr, NP 11/27/2023, 10:37 AM

## 2023-11-28 LAB — CBC WITH DIFFERENTIAL/PLATELET
Basophils Absolute: 0.1 10*3/uL (ref 0.0–0.2)
Basos: 1 %
EOS (ABSOLUTE): 0.1 10*3/uL (ref 0.0–0.4)
Eos: 2 %
Hematocrit: 42.7 % (ref 34.0–46.6)
Hemoglobin: 13.7 g/dL (ref 11.1–15.9)
Immature Grans (Abs): 0 10*3/uL (ref 0.0–0.1)
Immature Granulocytes: 0 %
Lymphocytes Absolute: 1.6 10*3/uL (ref 0.7–3.1)
Lymphs: 34 %
MCH: 26.9 pg (ref 26.6–33.0)
MCHC: 32.1 g/dL (ref 31.5–35.7)
MCV: 84 fL (ref 79–97)
Monocytes Absolute: 0.5 10*3/uL (ref 0.1–0.9)
Monocytes: 10 %
Neutrophils Absolute: 2.5 10*3/uL (ref 1.4–7.0)
Neutrophils: 53 %
Platelets: 369 10*3/uL (ref 150–450)
RBC: 5.09 x10E6/uL (ref 3.77–5.28)
RDW: 13.1 % (ref 11.7–15.4)
WBC: 4.8 10*3/uL (ref 3.4–10.8)

## 2023-11-28 LAB — CMP14+EGFR
ALT: 23 IU/L (ref 0–32)
AST: 17 IU/L (ref 0–40)
Albumin: 4.2 g/dL (ref 3.8–4.9)
Alkaline Phosphatase: 75 IU/L (ref 44–121)
BUN/Creatinine Ratio: 14 (ref 9–23)
BUN: 12 mg/dL (ref 6–24)
Bilirubin Total: 0.3 mg/dL (ref 0.0–1.2)
CO2: 24 mmol/L (ref 20–29)
Calcium: 10 mg/dL (ref 8.7–10.2)
Chloride: 104 mmol/L (ref 96–106)
Creatinine, Ser: 0.88 mg/dL (ref 0.57–1.00)
Globulin, Total: 3 g/dL (ref 1.5–4.5)
Glucose: 88 mg/dL (ref 70–99)
Potassium: 4.7 mmol/L (ref 3.5–5.2)
Sodium: 142 mmol/L (ref 134–144)
Total Protein: 7.2 g/dL (ref 6.0–8.5)
eGFR: 76 mL/min/{1.73_m2} (ref >59)

## 2023-11-28 LAB — LIPID PANEL
Chol/HDL Ratio: 2.9 ratio (ref 0.0–4.4)
Cholesterol, Total: 205 mg/dL — ABNORMAL HIGH (ref 100–199)
HDL: 71 mg/dL (ref >39)
LDL Chol Calc (NIH): 123 mg/dL — ABNORMAL HIGH (ref 0–99)
Triglycerides: 58 mg/dL (ref 0–149)
VLDL Cholesterol Cal: 11 mg/dL (ref 5–40)

## 2023-11-29 ENCOUNTER — Other Ambulatory Visit (INDEPENDENT_AMBULATORY_CARE_PROVIDER_SITE_OTHER): Payer: Self-pay | Admitting: Primary Care

## 2023-11-29 MED ORDER — PRAVASTATIN SODIUM 20 MG PO TABS: 20.0000 mg | ORAL_TABLET | Freq: Every day | ORAL | 1 refills | Status: DC

## 2023-12-28 ENCOUNTER — Ambulatory Visit (INDEPENDENT_AMBULATORY_CARE_PROVIDER_SITE_OTHER): Payer: Self-pay | Admitting: Primary Care

## 2023-12-28 ENCOUNTER — Encounter (INDEPENDENT_AMBULATORY_CARE_PROVIDER_SITE_OTHER): Payer: Self-pay | Admitting: Primary Care

## 2023-12-28 VITALS — BP 140/92 | HR 89 | Temp 98.1°F | Resp 16

## 2023-12-28 DIAGNOSIS — I10 Essential (primary) hypertension: Secondary | ICD-10-CM

## 2023-12-28 NOTE — Progress Notes (Unsigned)
Follow up blood pressure.

## 2023-12-28 NOTE — Progress Notes (Unsigned)
 [facility]  Ana Pratt, is a 58 y.o. female  [id number]  [id number]  DOB - [birth year]  Chief Complaint  Patient presents with  . Hypertension       Subjective:   Ana Pratt is a 58 y.o. female here today for a follow up visit.  Blood pressure follow-up.  Patient does particularly like taking medication rather try holistic and natural treatments.  Last visit refused to take blood pressure medicines.  She has done research on how to lower her blood pressure today her blood pressure is 140/92 explained her blood pressure needs to be less than 130/80.  She is eating beets which shows a natural benefit of lowering blood pressure.  She than ask the question is my blood pressure lower than the last time.  Reluctantly I said you are systolic was 150 today it is 140 and your diastolic is staying around 90s.  Then proceeded to explain that your heart is a muscle the heart rate works it can enlarge.  She understands this she will continue her beats and return with a controlled blood pressure per patient.  No headache, No chest pain, No abdominal pain - No Nausea, No new weakness tingling or numbness, No Cough - shortness of breath  No problems updated.  Comprehensive ROS Pertinent positive and negative noted in HPI   No Known Allergies  Past Medical History:  Diagnosis Date  . Allergy    Spring--uses Claritin D.  . Greater trochanteric bursitis of left hip 05/2015  . Knee pain, left 05/2015  . Shingles age 25 or 58 yo  . Shingles 08/08/2016   Left C8 dermatome    Current Outpatient Medications on File Prior to Visit  Medication Sig Dispense Refill  . pravastatin (PRAVACHOL) 20 MG tablet Take 1 tablet (20 mg total) by mouth daily. 90 tablet 1  . amLODipine (NORVASC) 10 MG tablet Take 1 tablet (10 mg total) by mouth daily. (Patient not taking: Reported on 12/28/2023) 90 tablet 1  . hydrochlorothiazide (HYDRODIURIL) 25 MG tablet Take 1 tablet (25 mg total) by  mouth daily. (Patient not taking: Reported on 12/28/2023) 90 tablet 1  . meloxicam (MOBIC) 15 MG tablet 1 tab by mouth daily with food as needed for pain (Patient not taking: Reported on 12/28/2023) 90 tablet 1   No current facility-administered medications on file prior to visit.   Health Maintenance  Topic Date Due  . Zoster (Shingles) Vaccine (1 of 2) Never done  . COVID-19 Vaccine (1 - 2024-25 season) Never done  . Mammogram  04/02/2024  . Flu Shot  04/26/2024  . DTaP/Tdap/Td vaccine (2 - Td or Tdap) 05/05/2024  . Colon Cancer Screening  07/26/2024  . Pap with HPV screening  10/21/2027  . Hepatitis C Screening  Completed  . HIV Screening  Completed  . HPV Vaccine  Aged Out    Objective:   Vitals:   12/28/23 1558 12/28/23 1600 12/28/23 1609  BP: (!) 170/103 (!) 142/95 (!) 140/92  Pulse: 89    Resp: 16    Temp: 98.1 F (36.7 C)    TempSrc: Oral    SpO2: 98%     BP Readings from Last 3 Encounters:  12/28/23 (!) 140/92  11/27/23 (!) 152/92  10/20/22 127/87      Physical Exam Vitals reviewed.  Constitutional:      Appearance: She is obese.  HENT:     Head: Normocephalic.     Nose: Nose normal.  Eyes:  Extraocular Movements: Extraocular movements intact.  Cardiovascular:     Rate and Rhythm: Normal rate and regular rhythm.  Pulmonary:     Effort: Pulmonary effort is normal.     Breath sounds: Normal breath sounds.  Abdominal:     General: Bowel sounds are normal. There is distension.     Palpations: Abdomen is soft.  Musculoskeletal:        General: Normal range of motion.     Cervical back: Normal range of motion.  Skin:    General: Skin is warm and dry.  Neurological:     Mental Status: She is oriented to person, place, and time.  Psychiatric:        Mood and Affect: Mood normal.        Behavior: Behavior normal.      Assessment & Plan  Ana Pratt was seen today for hypertension.  Diagnoses and all orders for this visit:  Essential  hypertension See HPI     Patient have been counseled extensively about nutrition and exercise. Other issues discussed during this visit include: low cholesterol diet, weight control and daily exercise, foot care, annual eye examinations at Ophthalmology, importance of adherence with medications and regular follow-up. We also discussed long term complications of uncontrolled diabetes and hypertension.   No follow-ups on file.  The patient was given clear instructions to go to ER or return to medical center if symptoms don't improve, worsen or new problems develop. The patient verbalized understanding. The patient was told to call to get lab results if they haven't heard anything in the next week.   This note has been created with Education officer, environmental. Any transcriptional errors are unintentional.   Grayce Sessions, NP 12/28/2023, 4:11 PM

## 2024-01-25 ENCOUNTER — Other Ambulatory Visit: Payer: Self-pay | Admitting: Primary Care

## 2024-01-25 DIAGNOSIS — Z1231 Encounter for screening mammogram for malignant neoplasm of breast: Secondary | ICD-10-CM

## 2024-02-01 ENCOUNTER — Ambulatory Visit
Admission: RE | Admit: 2024-02-01 | Discharge: 2024-02-01 | Disposition: A | Discharge status: Home / Self Care | Source: Ambulatory Visit | Attending: Primary Care | Admitting: Primary Care

## 2024-02-01 DIAGNOSIS — Z1231 Encounter for screening mammogram for malignant neoplasm of breast: Secondary | ICD-10-CM

## 2024-08-14 ENCOUNTER — Ambulatory Visit (INDEPENDENT_AMBULATORY_CARE_PROVIDER_SITE_OTHER): Payer: Self-pay | Admitting: Primary Care

## 2024-08-26 ENCOUNTER — Ambulatory Visit (INDEPENDENT_AMBULATORY_CARE_PROVIDER_SITE_OTHER): Payer: Self-pay | Admitting: Primary Care

## 2024-08-27 ENCOUNTER — Encounter (INDEPENDENT_AMBULATORY_CARE_PROVIDER_SITE_OTHER): Payer: Self-pay | Admitting: Primary Care

## 2024-08-27 ENCOUNTER — Ambulatory Visit (INDEPENDENT_AMBULATORY_CARE_PROVIDER_SITE_OTHER): Payer: Self-pay | Admitting: Primary Care

## 2024-08-27 VITALS — BP 136/90 | HR 84 | Resp 16 | Ht 60.0 in | Wt 208.0 lb

## 2024-08-27 DIAGNOSIS — E785 Hyperlipidemia, unspecified: Secondary | ICD-10-CM

## 2024-08-27 DIAGNOSIS — I10 Essential (primary) hypertension: Secondary | ICD-10-CM

## 2024-08-27 MED ORDER — HYDROCHLOROTHIAZIDE 25 MG PO TABS: 25.0000 mg | ORAL_TABLET | Freq: Every day | ORAL | 1 refills | Status: AC

## 2024-08-27 NOTE — Patient Instructions (Signed)
 Hydrochlorothiazide  Capsules or Tablets What is this medication? HYDROCHLOROTHIAZIDE  (hye DROE klor oh THYE a zide ) treats high blood pressure. It may also be used to reduce swelling related to heart, kidney, or liver disease. It works by helping your kidneys remove more fluid and salt from your blood through the urine. It belongs to a group of medications called diuretics. This medicine may be used for other purposes; ask your health care provider or pharmacist if you have questions. COMMON BRAND NAME(S): Esidrix , Ezide, HydroDIURIL , Microzide , Oretic , Zide  What should I tell my care team before I take this medication? They need to know if you have any of these conditions: Diabetes Gout Kidney disease Liver disease Lupus An unusual or allergic reaction to hydrochlorothiazide , other medications, foods, dyes, or preservatives Pregnant or trying to get pregnant Breastfeeding How should I use this medication? Take this medication by mouth. Take it as directed on the prescription label at the same time every day. You can take it with or without food. If it upsets your stomach, take it with food. Keep taking it unless your care team tells you to stop. Talk to your care team about the use of this medication in children. While it may be prescribed for children for selected conditions, precautions do apply. Overdosage: If you think you have taken too much of this medicine contact a poison control center or emergency room at once. NOTE: This medicine is only for you. Do not share this medicine with others. What if I miss a dose? If you miss a dose, take it as soon as you can. If it is almost time for your next dose, take only that dose. Do not take double or extra doses. What may interact with this medication? Do not take this medication with any of the following: Cidofovir Dofetilide Tranylcypromine This medication may also interact with the  following: Cholestyramine Colestipol Digoxin Lithium Medications for diabetes NSAIDS, medications for pain and inflammation, such as ibuprofen  or naproxen Other medications for blood pressure This list may not describe all possible interactions. Give your health care provider a list of all the medicines, herbs, non-prescription drugs, or dietary supplements you use. Also tell them if you smoke, drink alcohol, or use illegal drugs. Some items may interact with your medicine. What should I watch for while using this medication? Visit your care team for regular checks on your progress. Check your blood pressure as directed. Know what your blood pressure should be and when to contact your care team. This medication may affect your coordination, reaction time, or judgment. Do not drive or operate machinery until you know how this medication affects you. Sit up or stand slowly to reduce the risk of dizzy or fainting spells. Drinking alcohol with this medication can increase the risk of these side effects. Taking this medication is only part of a total heart healthy program. Ask your care team if there are other changes you can make to improve your overall health. Do not treat yourself for coughs, colds, or pain while you are using this medication without asking your care team for advice. Some medications may increase your blood pressure. Talk to your care team about your risk of skin cancer. You may be more at risk for skin cancer if you take this medication. To lower your risk, keep out of the sun. If you cannot avoid being in the sun, wear protective clothing and sunscreen. Do not use sun lamps, tanning beds, or tanning booths. This medication may increase blood sugar.  The risk may be higher in patients who already have diabetes. Ask your care team what you can do to lower your risk of diabetes while taking this medication. What side effects may I notice from receiving this medication? Side effects that  you should report to your care team as soon as possible: Allergic reactions--skin rash, itching, hives, swelling of the face, lips, tongue, or throat Dehydration--increased thirst, dry mouth, feeling faint or lightheaded, headache, dark yellow or brown urine Gout--severe pain, redness, warmth, or swelling in joints, such as the big toe Kidney injury--decrease in the amount of urine, swelling of the ankles, hands, or feet Low blood pressure--dizziness, feeling faint or lightheaded, blurry vision Low potassium level--muscle pain or cramps, unusual weakness, fatigue, fast or irregular heartbeat, constipation Sudden eye pain or change in vision such as blurred vision, seeing halos around lights, vision loss Side effects that usually do not require medical attention (report to your care team if they continue or are bothersome): Change in sex drive or performance Headache Upset stomach This list may not describe all possible side effects. Call your doctor for medical advice about side effects. You may report side effects to FDA at 1-800-FDA-1088. Where should I keep my medication? Keep out of the reach of children and pets. Store at room temperature between 20 and 25 degrees C (68 and 77 degrees F). Protect from light and moisture. Keep the container tightly closed. Do not freeze. Get rid of any unused medication after the expiration date. To get rid of medications that are no longer needed or have expired: Take the medication to a take-back program. Check with your pharmacy or law enforcement to find a location. If you cannot return the medication, check the label or package insert to see if the medication should be thrown out in the garbage or flushed down the toilet. If you are not sure, ask your care team. If it is safe to put it in the trash, empty the medication out of the container. Mix it with cat litter, dirt, coffee grounds, or another unwanted substance. Seal the mixture in a bag or container.  Put it in the trash. NOTE: This sheet is a summary. It may not cover all possible information. If you have questions about this medicine, talk to your doctor, pharmacist, or health care provider.  2025 Elsevier/Gold Standard (2023-11-10 00:00:00)

## 2024-08-27 NOTE — Progress Notes (Signed)
 [facility]  Ana Pratt, is a 58 y.o. female  [id number]  [id number]  DOB - 08/08/1966  Chief Complaint  Patient presents with   Hypertension    Not on any medication   Form Completion    Pt brung in a form from [facility] for dental       Subjective:   Ana Pratt is a 58 y.o. female here today for a follow up visit. Patient has No headache, No chest pain, No abdominal pain - No Nausea, No new weakness tingling or numbness, No Cough - shortness of breath.  Patient went for her job physical.  Blood pressure was elevated to 178/120.  Previously reviewed blood pressure and patient was not rated to be 1 anything she would be able to manage and handle her blood pressure on her own.  Today she is in agreement we will start blood pressure medication return in 2 to 4 weeks for blood pressure recheck to make sure her blood pressure is controlled today blood pressure initially was 136 /90-second reading was 140/88 significantly better than what she presented with on her paperwork. Hypertension    No problems updated.  Comprehensive ROS Pertinent positive and negative noted in HPI   No Known Allergies  Past Medical History:  Diagnosis Date   Allergy    Spring--uses Claritin D.   Greater trochanteric bursitis of left hip 05/2015   Knee pain, left 05/2015   Shingles age 54 or 58 yo   Shingles 08/08/2016   Left C8 dermatome    No current outpatient medications on file prior to visit.   No current facility-administered medications on file prior to visit.   Health Maintenance  Topic Date Due   Pneumococcal Vaccine for age over 28 (1 of 1 - PCV) Never done   Zoster (Shingles) Vaccine (1 of 2) Never done   Flu Shot  04/26/2024   DTaP/Tdap/Td vaccine (2 - Td or Tdap) 05/05/2024   COVID-19 Vaccine (1 - 2025-26 season) Never done   Colon Cancer Screening  07/26/2024   Breast Cancer Screening  01/31/2026   Pap with HPV screening  10/21/2027   Hepatitis C  Screening  Completed   HIV Screening  Completed   Hepatitis B Vaccine  Aged Out   HPV Vaccine  Aged Out   Meningitis B Vaccine  Aged Out    Objective:   Vitals:   08/27/24 1419  BP: (!) 136/90  Pulse: 84  Resp: 16  SpO2: 99%  Weight: 208 lb (94.3 kg)  Height: 5' (1.524 m)   BP Readings from Last 3 Encounters:  08/27/24 (!) 136/90  12/28/23 (!) 140/92  11/27/23 (!) 152/92      Physical Exam Vitals reviewed.  Constitutional:      Appearance: Normal appearance. She is obese.     Comments: morbid  HENT:     Head: Normocephalic.     Right Ear: Tympanic membrane, ear canal and external ear normal.     Left Ear: Tympanic membrane, ear canal and external ear normal.     Nose: Nose normal.     Mouth/Throat:     Mouth: Mucous membranes are moist.  Eyes:     Extraocular Movements: Extraocular movements intact.     Pupils: Pupils are equal, round, and reactive to light.  Cardiovascular:     Rate and Rhythm: Normal rate.  Pulmonary:     Effort: Pulmonary effort is normal.     Breath sounds: Normal breath sounds.  Abdominal:  General: Bowel sounds are normal.     Palpations: Abdomen is soft.  Musculoskeletal:        General: Normal range of motion.     Cervical back: Normal range of motion.  Skin:    General: Skin is warm and dry.  Neurological:     Mental Status: She is alert and oriented to person, place, and time.  Psychiatric:        Mood and Affect: Mood normal.        Behavior: Behavior normal.        Thought Content: Thought content normal.    Assessment & Plan  Ana Pratt was seen today for hypertension and form completion.  Diagnoses and all orders for this visit:  Essential hypertension BP goal - < 140/90 Explained that having normal blood pressure is the goal and medications are helping to get to goal and maintain normal blood pressure. DIET: Limit salt intake, read nutrition labels to check salt content, limit fried and high fatty foods  Avoid using  multisymptom OTC cold preparations that generally contain sudafed which can rise BP. Consult with pharmacist on best cold relief products to use for persons with HTN EXERCISE Discussed incorporating exercise such as walking - 30 minutes most days of the week and can do in 10 minute intervals    -     hydrochlorothiazide  (HYDRODIURIL ) 25 MG tablet; Take 1 tablet (25 mg total) by mouth daily.     Patient have been counseled extensively about nutrition and exercise. Other issues discussed during this visit include: low cholesterol diet, weight control and daily exercise, foot care, annual eye examinations at Ophthalmology, importance of adherence with medications and regular follow-up. We also discussed long term complications of uncontrolled diabetes and hypertension.   Return for re-check blood pressure.  The patient was given clear instructions to go to ER or return to medical center if symptoms don't improve, worsen or new problems develop. The patient verbalized understanding. The patient was told to call to get lab results if they haven't heard anything in the next week.   This note has been created with Education officer, environmental. Any transcriptional errors are unintentional.   Ana SHAUNNA Bohr, NP 08/27/2024, 3:00 PM

## 2024-09-30 ENCOUNTER — Telehealth (INDEPENDENT_AMBULATORY_CARE_PROVIDER_SITE_OTHER): Payer: Self-pay | Admitting: Primary Care

## 2024-09-30 ENCOUNTER — Ambulatory Visit (INDEPENDENT_AMBULATORY_CARE_PROVIDER_SITE_OTHER): Payer: Self-pay | Admitting: Primary Care

## 2024-09-30 NOTE — Telephone Encounter (Signed)
 Called pt to confirm appt. Pt stated that she has to switch providers do to the new insurance not being in network. Please advise.

## 2024-10-01 ENCOUNTER — Telehealth (INDEPENDENT_AMBULATORY_CARE_PROVIDER_SITE_OTHER): Payer: Self-pay

## 2024-10-01 ENCOUNTER — Ambulatory Visit (INDEPENDENT_AMBULATORY_CARE_PROVIDER_SITE_OTHER): Payer: Self-pay | Admitting: Primary Care

## 2024-10-01 NOTE — Telephone Encounter (Signed)
 Contacted pt to reschedule appointment pt didn't answer lvm
